# Patient Record
Sex: Female | Born: 1945 | Race: White | Hispanic: No | Marital: Married | Smoking: Never smoker
Health system: Southern US, Community
[De-identification: ages and names within clinical notes are randomized; demographics above are authoritative.]

## PROBLEM LIST (undated history)

## (undated) DIAGNOSIS — M1990 Unspecified osteoarthritis, unspecified site: Secondary | ICD-10-CM

## (undated) DIAGNOSIS — F419 Anxiety disorder, unspecified: Secondary | ICD-10-CM

## (undated) DIAGNOSIS — I10 Essential (primary) hypertension: Secondary | ICD-10-CM

## (undated) HISTORY — PX: COLONOSCOPY: SHX174

## (undated) HISTORY — PX: TONSILLECTOMY: SUR1361

## (undated) HISTORY — PX: ABDOMINAL HYSTERECTOMY: SHX81

## (undated) HISTORY — DX: Essential (primary) hypertension: I10

---

## 2001-04-28 ENCOUNTER — Encounter: Payer: Self-pay | Admitting: Family Medicine

## 2001-04-28 ENCOUNTER — Ambulatory Visit (HOSPITAL_COMMUNITY): Admission: RE | Admit: 2001-04-28 | Discharge: 2001-04-28 | Payer: Self-pay | Admitting: Family Medicine

## 2001-05-20 ENCOUNTER — Ambulatory Visit (HOSPITAL_COMMUNITY): Admission: RE | Admit: 2001-05-20 | Discharge: 2001-05-20 | Payer: Self-pay | Admitting: General Surgery

## 2004-05-15 ENCOUNTER — Ambulatory Visit (HOSPITAL_COMMUNITY): Admission: RE | Admit: 2004-05-15 | Discharge: 2004-05-15 | Payer: Self-pay | Admitting: Family Medicine

## 2006-08-12 ENCOUNTER — Ambulatory Visit (HOSPITAL_COMMUNITY): Admission: RE | Admit: 2006-08-12 | Discharge: 2006-08-12 | Payer: Self-pay | Admitting: Family Medicine

## 2006-08-20 ENCOUNTER — Encounter: Admission: RE | Admit: 2006-08-20 | Discharge: 2006-08-20 | Payer: Self-pay | Admitting: Family Medicine

## 2007-09-01 ENCOUNTER — Encounter: Admission: RE | Admit: 2007-09-01 | Discharge: 2007-09-01 | Payer: Self-pay | Admitting: Family Medicine

## 2010-08-20 ENCOUNTER — Encounter: Payer: Self-pay | Admitting: Family Medicine

## 2011-09-27 ENCOUNTER — Ambulatory Visit: Payer: Medicare Other | Attending: Orthopedic Surgery | Admitting: Physical Therapy

## 2011-09-27 DIAGNOSIS — IMO0001 Reserved for inherently not codable concepts without codable children: Secondary | ICD-10-CM | POA: Insufficient documentation

## 2011-09-27 DIAGNOSIS — M25569 Pain in unspecified knee: Secondary | ICD-10-CM | POA: Insufficient documentation

## 2011-09-27 DIAGNOSIS — R5381 Other malaise: Secondary | ICD-10-CM | POA: Insufficient documentation

## 2011-10-29 ENCOUNTER — Emergency Department (HOSPITAL_COMMUNITY): Payer: Medicare Other

## 2011-10-29 ENCOUNTER — Emergency Department (HOSPITAL_COMMUNITY)
Admission: EM | Admit: 2011-10-29 | Discharge: 2011-10-29 | Disposition: A | Discharge status: Home / Self Care | Payer: Medicare Other | Attending: Emergency Medicine | Admitting: Emergency Medicine

## 2011-10-29 ENCOUNTER — Encounter (HOSPITAL_COMMUNITY): Payer: Self-pay | Admitting: *Deleted

## 2011-10-29 DIAGNOSIS — M25539 Pain in unspecified wrist: Secondary | ICD-10-CM | POA: Insufficient documentation

## 2011-10-29 DIAGNOSIS — W19XXXA Unspecified fall, initial encounter: Secondary | ICD-10-CM | POA: Insufficient documentation

## 2011-10-29 DIAGNOSIS — Y9289 Other specified places as the place of occurrence of the external cause: Secondary | ICD-10-CM | POA: Insufficient documentation

## 2011-10-29 DIAGNOSIS — S62109A Fracture of unspecified carpal bone, unspecified wrist, initial encounter for closed fracture: Secondary | ICD-10-CM | POA: Insufficient documentation

## 2011-10-29 MED ORDER — OXYCODONE-ACETAMINOPHEN 5-325 MG PO TABS: 1.0000 | ORAL_TABLET | ORAL | Status: AC | PRN

## 2011-10-29 MED ORDER — OXYCODONE-ACETAMINOPHEN 5-325 MG PO TABS
1.0000 | ORAL_TABLET | Freq: Once | ORAL | Status: AC
  Administered 2011-10-29: 1 via ORAL
  Filled 2011-10-29: qty 1

## 2011-10-29 MED ORDER — FENTANYL CITRATE 0.05 MG/ML IJ SOLN: 50.0000 ug | INTRAMUSCULAR | Status: DC

## 2011-10-29 NOTE — ED Notes (Signed)
Splint requested from ortho tech

## 2011-10-29 NOTE — ED Notes (Signed)
Ortho at bedside.

## 2011-10-29 NOTE — Progress Notes (Signed)
Orthopedic Tech Progress Note Patient Details:  Joanna Jones July 25, 1946 [id number]  Type of Splint: Short Arm Splint Location: (L) UE Splint Interventions: Application    Jennye Moccasin 10/29/2011, 5:58 PM

## 2011-10-29 NOTE — ED Notes (Signed)
Pt stated that she fell in the ED parking lot and hurt her left wrist. Pt able to move hand. Capillary refill brisk. Arm warm to touch. Swelling present. No deformity noted. Ice on arm. Will continue to monitor.

## 2011-10-29 NOTE — Progress Notes (Signed)
Orthopedic Tech Progress Note Patient Details:  Joanna Jones Sep 18, 1945 [id number]       Jennye Moccasin 10/29/2011, 5:56 PM

## 2011-10-29 NOTE — ED Notes (Signed)
The pt  Larey Seat in the parking lot here  When she tripped over a sign.  c c/o lt wrist pain

## 2011-10-29 NOTE — Discharge Instructions (Signed)
Wrist Fracture Your caregiver has diagnosed you as having a fracture of the wrist. A fracture is a break in the bone or bones. A cast or splint is used to protect and keep your injured bone(s) from moving. The cast or splint will usually be on for about 5 to 6 weeks. One of the bones of the wrist (the navicular bone) often does not show up as a fracture on X-ray until later or in the healing phase. With this bone your caregiver will often cast as though it is fractured even if not seen on the X-ray. HOME CARE INSTRUCTIONS   To lessen the swelling, keep the injured part elevated while sitting or lying down. Keeping the injury above the level of your heart (the center of the chest) will decrease swelling and pain.   Do not wear rings or jewelry on the injured hand or wrist.   Apply ice to the injury for 15 to 20 minutes, 3 to 4 times per day while awake for 2 days. Put the ice in a plastic bag and place a thin towel between the bag of ice and your cast.   If you have a plaster or fiberglass cast:   Do not try to scratch the skin under the cast using sharp or pointed objects.   Check the skin around the cast every day. You may put lotion on any red or sore areas.   Keep your cast dry and clean.   If you have a plaster splint:   Wear the splint as directed.   You may loosen the elastic around the splint if your fingers become numb, tingle, or turn cold or blue.   If you have been put in a removable splint, wear and use as directed.   Do not use powders or deodorants in or around the cast or splint.   Do not remove padding from your cast or splint.   Do not put pressure on any part of your cast or splint. It may break. Rest your cast or splint only on a pillow the first 24 hours until it is fully hardened.   Gently move your fingers often, so they do not get stiff.   Do not remove the splint unless directed by your caregiver. Casts must be removed by an orthopedist.   Your cast or  splint can be protected during bathing with a plastic bag. Do not lower the cast or splint into water.   Only take over-the-counter or prescription medicines for pain, discomfort, or fever as directed by your caregiver.   Follow up with your caregiver as directed.  SEEK IMMEDIATE MEDICAL CARE IF:   Your cast or splint gets damaged or breaks.   Your cast or splint feels too tight or loose.   You have increased pain, not controlled with medication.   You have increased swelling.   Your skin or nails below the injury turn blue or grey or feel cold or numb.   You have trouble moving or feeling your fingers.   You experience any burning or stinging from the cast or splint.   There is a bad smell coming from under the cast or splint.   New stains or fluids are coming from under the cast or splint.   You have any new injuries while wearing the cast or splint.  Document Released: 04/25/2005 Document Revised: 07/05/2011 Document Reviewed: 02/12/2007 [facility] Patient Information 2012 [place], [place].Cast or Splint Care Casts and splints support injured limbs and keep bones  from moving while they heal.  HOME CARE  Keep the cast or splint uncovered during the drying period.   A plaster cast can take 24 to 48 hours to dry.   A fiberglass cast will dry in less than 1 hour.   Do not rest the cast on anything harder than a pillow for 24 hours.   Do not put weight on your injured limb. Do not put pressure on the cast. Wait for your doctor's approval.   Keep the cast or splint dry.   Cover the cast or splint with a plastic bag during baths or wet weather.   If you have a cast over your chest and belly (trunk), take sponge baths until the cast is taken off.   Keep your cast or splint clean. Wash a dirty cast with a damp cloth.   Do not put any objects under your cast or splint. Do not scratch the skin under the cast with an object.   Do not take out the padding from inside your cast.     Exercise your joints near the cast as told by your doctor.   Raise (elevate) your injured limb on 1 or 2 pillows for the first 1 to 3 days.  GET HELP RIGHT AWAY IF:  Your cast or splint cracks.   Your cast or splint is too tight or too loose.   You itch badly under the cast.   Your cast gets wet or has a soft spot.   You have a bad smell coming from the cast.   You get an object stuck under the cast.   Your skin around the cast becomes red or raw.   You have new or more pain after the cast is put on.   You have fluid leaking through the cast.   You cannot move your fingers or toes.   Your fingers or toes turn colors or are cool, painful, or puffy (swollen).   You have tingling or lose feeling (numbness) around the injured area.   You have pain or pressure under the cast.   You have trouble breathing or have shortness of breath.   You have chest pain.  MAKE SURE YOU:  Understand these instructions.   Will watch your condition.   Will get help right away if you are not doing well or get worse.  Document Released: 11/15/2010 Document Revised: 07/05/2011 Document Reviewed: 11/15/2010 [facility] Patient Information 2012 [place], [place].

## 2011-10-29 NOTE — ED Provider Notes (Signed)
History     CSN: [id number]  Arrival date & time 10/29/11  1551   First MD Initiated Contact with Patient 10/29/11 1727      Chief Complaint  Patient presents with  . Wrist Pain    (Consider location/radiation/quality/duration/timing/severity/associated sxs/prior treatment) Patient is a 66 y.o. female presenting with wrist pain. The history is provided by the patient.  Wrist Pain   patient fell in the parking lot onto her left wrist. No loss of consciousness pain is worse with movement described as sharp. Better with remaining still. Denies any hip or lower back pain. Denies any elbow pain. No paresthesias of her left hand  History reviewed. No pertinent past medical history.  History reviewed. No pertinent past surgical history.  No family history on file.  History  Substance Use Topics  . Smoking status: Never Smoker   . Smokeless tobacco: Not on file  . Alcohol Use: No    OB History    Grav Para Term Preterm Abortions TAB SAB Ect Mult Living                  Review of Systems  All other systems reviewed and are negative.    Allergies  Compazine  Home Medications   Current Outpatient Rx  Name Route Sig Dispense Refill  . ESTROGENS CONJUGATED 1.25 MG PO TABS Oral Take 0.625 mg by mouth daily.      BP 166/81  Pulse 67  Temp(Src) 97.8 F (36.6 C) (Oral)  Resp 16  SpO2 97%  Physical Exam  Nursing note and vitals reviewed. Constitutional: She is oriented to person, place, and time. She appears well-developed and well-nourished.  Non-toxic appearance.  HENT:  Head: Normocephalic and atraumatic.  Eyes: Conjunctivae are normal. Pupils are equal, round, and reactive to light.  Neck: Normal range of motion.  Cardiovascular: Normal rate.   Pulmonary/Chest: Effort normal.  Musculoskeletal:       Left wrist: She exhibits tenderness, bony tenderness and swelling.       Arms: Neurological: She is alert and oriented to person, place, and time.  Skin: Skin is  warm and dry.  Psychiatric: She has a normal mood and affect.    ED Course  Procedures (including critical care time)  Labs Reviewed - No data to display Dg Wrist Complete Left  10/29/2011  *RADIOLOGY REPORT*  Clinical Data: Wrist pain  LEFT WRIST - COMPLETE 3+ VIEW  Comparison: None.  Findings: Diffuse osteopenia.  There is a small nondisplaced fracture involving the radial styloid.  No subluxations.  No radiopaque foreign bodies or soft tissue calcifications.  IMPRESSION:  1. Nondisplaced radial styloid fracture.  2.  Osteopenia.  Original Report Authenticated By: Rosealee Albee, M.D.     No diagnosis found.    MDM  X-rays results reviewed. Patient given Percocet for pain. She'll be placed in a splint and given referral to hand surgeon on call        Toy Baker, MD 10/29/11 1734

## 2011-12-10 ENCOUNTER — Other Ambulatory Visit (HOSPITAL_COMMUNITY): Payer: Self-pay | Admitting: Family Medicine

## 2011-12-10 ENCOUNTER — Ambulatory Visit (HOSPITAL_COMMUNITY)
Admission: RE | Admit: 2011-12-10 | Discharge: 2011-12-10 | Disposition: A | Discharge status: Home / Self Care | Payer: Medicare Other | Source: Ambulatory Visit | Attending: Family Medicine | Admitting: Family Medicine

## 2011-12-10 DIAGNOSIS — R079 Chest pain, unspecified: Secondary | ICD-10-CM

## 2012-01-07 ENCOUNTER — Other Ambulatory Visit (HOSPITAL_COMMUNITY): Payer: Self-pay | Admitting: Family Medicine

## 2012-01-07 DIAGNOSIS — Z139 Encounter for screening, unspecified: Secondary | ICD-10-CM

## 2012-01-10 ENCOUNTER — Other Ambulatory Visit (HOSPITAL_COMMUNITY): Payer: Medicare Other

## 2012-01-10 ENCOUNTER — Ambulatory Visit (HOSPITAL_COMMUNITY): Payer: Medicare Other

## 2012-01-14 ENCOUNTER — Ambulatory Visit (HOSPITAL_COMMUNITY)
Admission: RE | Admit: 2012-01-14 | Discharge: 2012-01-14 | Disposition: A | Discharge status: Home / Self Care | Payer: Medicare Other | Source: Ambulatory Visit | Attending: Family Medicine | Admitting: Family Medicine

## 2012-01-14 DIAGNOSIS — Z139 Encounter for screening, unspecified: Secondary | ICD-10-CM

## 2012-01-14 DIAGNOSIS — Z1231 Encounter for screening mammogram for malignant neoplasm of breast: Secondary | ICD-10-CM | POA: Insufficient documentation

## 2012-01-14 DIAGNOSIS — Z13820 Encounter for screening for osteoporosis: Secondary | ICD-10-CM | POA: Insufficient documentation

## 2012-01-14 DIAGNOSIS — M818 Other osteoporosis without current pathological fracture: Secondary | ICD-10-CM | POA: Insufficient documentation

## 2013-09-03 ENCOUNTER — Ambulatory Visit (HOSPITAL_COMMUNITY)
Admission: RE | Admit: 2013-09-03 | Discharge: 2013-09-03 | Disposition: A | Discharge status: Home / Self Care | Payer: Medicare Other | Source: Ambulatory Visit | Attending: Family Medicine | Admitting: Family Medicine

## 2013-09-03 ENCOUNTER — Other Ambulatory Visit (HOSPITAL_COMMUNITY): Payer: Self-pay | Admitting: Family Medicine

## 2013-09-03 DIAGNOSIS — J069 Acute upper respiratory infection, unspecified: Secondary | ICD-10-CM

## 2013-09-03 DIAGNOSIS — R05 Cough: Secondary | ICD-10-CM | POA: Insufficient documentation

## 2013-09-03 DIAGNOSIS — R059 Cough, unspecified: Secondary | ICD-10-CM | POA: Insufficient documentation

## 2013-09-03 DIAGNOSIS — R079 Chest pain, unspecified: Secondary | ICD-10-CM | POA: Insufficient documentation

## 2013-09-03 DIAGNOSIS — R0989 Other specified symptoms and signs involving the circulatory and respiratory systems: Secondary | ICD-10-CM | POA: Insufficient documentation

## 2015-02-22 ENCOUNTER — Other Ambulatory Visit (HOSPITAL_COMMUNITY): Payer: Self-pay | Admitting: Family Medicine

## 2015-02-22 DIAGNOSIS — Z1389 Encounter for screening for other disorder: Secondary | ICD-10-CM

## 2015-02-22 DIAGNOSIS — Z0000 Encounter for general adult medical examination without abnormal findings: Secondary | ICD-10-CM | POA: Diagnosis not present

## 2015-02-22 DIAGNOSIS — Z139 Encounter for screening, unspecified: Secondary | ICD-10-CM

## 2015-02-22 DIAGNOSIS — Z6824 Body mass index (BMI) 24.0-24.9, adult: Secondary | ICD-10-CM | POA: Diagnosis not present

## 2015-02-22 DIAGNOSIS — E782 Mixed hyperlipidemia: Secondary | ICD-10-CM | POA: Diagnosis not present

## 2015-02-28 ENCOUNTER — Ambulatory Visit (HOSPITAL_COMMUNITY)
Admission: RE | Admit: 2015-02-28 | Discharge: 2015-02-28 | Disposition: A | Discharge status: Home / Self Care | Payer: Medicare Other | Source: Ambulatory Visit | Attending: Family Medicine | Admitting: Family Medicine

## 2015-02-28 DIAGNOSIS — Z79890 Hormone replacement therapy (postmenopausal): Secondary | ICD-10-CM | POA: Diagnosis not present

## 2015-02-28 DIAGNOSIS — Z1231 Encounter for screening mammogram for malignant neoplasm of breast: Secondary | ICD-10-CM | POA: Insufficient documentation

## 2015-02-28 DIAGNOSIS — Z1389 Encounter for screening for other disorder: Secondary | ICD-10-CM

## 2015-02-28 DIAGNOSIS — Z780 Asymptomatic menopausal state: Secondary | ICD-10-CM | POA: Insufficient documentation

## 2015-02-28 DIAGNOSIS — M810 Age-related osteoporosis without current pathological fracture: Secondary | ICD-10-CM | POA: Insufficient documentation

## 2015-02-28 DIAGNOSIS — Z139 Encounter for screening, unspecified: Secondary | ICD-10-CM

## 2015-03-04 DIAGNOSIS — M79621 Pain in right upper arm: Secondary | ICD-10-CM | POA: Diagnosis not present

## 2015-03-04 DIAGNOSIS — S79921A Unspecified injury of right thigh, initial encounter: Secondary | ICD-10-CM | POA: Diagnosis not present

## 2015-03-04 DIAGNOSIS — Z90710 Acquired absence of both cervix and uterus: Secondary | ICD-10-CM | POA: Diagnosis not present

## 2015-03-04 DIAGNOSIS — M79651 Pain in right thigh: Secondary | ICD-10-CM | POA: Diagnosis not present

## 2015-03-04 DIAGNOSIS — E785 Hyperlipidemia, unspecified: Secondary | ICD-10-CM | POA: Diagnosis not present

## 2015-03-04 DIAGNOSIS — Z79890 Hormone replacement therapy (postmenopausal): Secondary | ICD-10-CM | POA: Diagnosis not present

## 2015-03-04 DIAGNOSIS — W1839XA Other fall on same level, initial encounter: Secondary | ICD-10-CM | POA: Diagnosis not present

## 2015-03-04 DIAGNOSIS — S4991XA Unspecified injury of right shoulder and upper arm, initial encounter: Secondary | ICD-10-CM | POA: Diagnosis not present

## 2015-03-04 DIAGNOSIS — E0590 Thyrotoxicosis, unspecified without thyrotoxic crisis or storm: Secondary | ICD-10-CM | POA: Diagnosis not present

## 2015-03-04 DIAGNOSIS — M79601 Pain in right arm: Secondary | ICD-10-CM | POA: Diagnosis not present

## 2015-03-04 DIAGNOSIS — S72001A Fracture of unspecified part of neck of right femur, initial encounter for closed fracture: Secondary | ICD-10-CM | POA: Diagnosis not present

## 2015-03-05 ENCOUNTER — Inpatient Hospital Stay (HOSPITAL_COMMUNITY): Payer: Medicare Other

## 2015-03-05 ENCOUNTER — Inpatient Hospital Stay (HOSPITAL_COMMUNITY): Payer: Medicare Other | Admitting: Anesthesiology

## 2015-03-05 ENCOUNTER — Inpatient Hospital Stay (HOSPITAL_COMMUNITY)
Admission: AD | Admit: 2015-03-05 | Discharge: 2015-03-08 | DRG: 470 | Disposition: A | Discharge status: Skilled Nursing Facility | Payer: Medicare Other | Source: Other Acute Inpatient Hospital | Attending: Internal Medicine | Admitting: Internal Medicine

## 2015-03-05 ENCOUNTER — Encounter (HOSPITAL_COMMUNITY)
Admission: AD | Disposition: A | Discharge status: Skilled Nursing Facility | Payer: Self-pay | Source: Other Acute Inpatient Hospital | Attending: Internal Medicine

## 2015-03-05 ENCOUNTER — Encounter (HOSPITAL_COMMUNITY): Payer: Self-pay | Admitting: Internal Medicine

## 2015-03-05 DIAGNOSIS — Z79899 Other long term (current) drug therapy: Secondary | ICD-10-CM | POA: Diagnosis not present

## 2015-03-05 DIAGNOSIS — S72001A Fracture of unspecified part of neck of right femur, initial encounter for closed fracture: Secondary | ICD-10-CM | POA: Diagnosis not present

## 2015-03-05 DIAGNOSIS — M25551 Pain in right hip: Secondary | ICD-10-CM | POA: Diagnosis not present

## 2015-03-05 DIAGNOSIS — R52 Pain, unspecified: Secondary | ICD-10-CM

## 2015-03-05 DIAGNOSIS — S72011A Unspecified intracapsular fracture of right femur, initial encounter for closed fracture: Secondary | ICD-10-CM | POA: Diagnosis not present

## 2015-03-05 DIAGNOSIS — E876 Hypokalemia: Secondary | ICD-10-CM | POA: Diagnosis not present

## 2015-03-05 DIAGNOSIS — Y92009 Unspecified place in unspecified non-institutional (private) residence as the place of occurrence of the external cause: Secondary | ICD-10-CM | POA: Diagnosis not present

## 2015-03-05 DIAGNOSIS — M818 Other osteoporosis without current pathological fracture: Secondary | ICD-10-CM | POA: Diagnosis not present

## 2015-03-05 DIAGNOSIS — W010XXA Fall on same level from slipping, tripping and stumbling without subsequent striking against object, initial encounter: Secondary | ICD-10-CM | POA: Diagnosis not present

## 2015-03-05 DIAGNOSIS — Z8249 Family history of ischemic heart disease and other diseases of the circulatory system: Secondary | ICD-10-CM | POA: Diagnosis not present

## 2015-03-05 DIAGNOSIS — S72041A Displaced fracture of base of neck of right femur, initial encounter for closed fracture: Secondary | ICD-10-CM | POA: Diagnosis not present

## 2015-03-05 DIAGNOSIS — Z96641 Presence of right artificial hip joint: Secondary | ICD-10-CM | POA: Diagnosis not present

## 2015-03-05 DIAGNOSIS — D500 Iron deficiency anemia secondary to blood loss (chronic): Secondary | ICD-10-CM | POA: Diagnosis not present

## 2015-03-05 DIAGNOSIS — S72009A Fracture of unspecified part of neck of unspecified femur, initial encounter for closed fracture: Secondary | ICD-10-CM | POA: Diagnosis present

## 2015-03-05 DIAGNOSIS — Z833 Family history of diabetes mellitus: Secondary | ICD-10-CM

## 2015-03-05 DIAGNOSIS — Z471 Aftercare following joint replacement surgery: Secondary | ICD-10-CM | POA: Diagnosis not present

## 2015-03-05 DIAGNOSIS — M6281 Muscle weakness (generalized): Secondary | ICD-10-CM | POA: Diagnosis not present

## 2015-03-05 DIAGNOSIS — M62838 Other muscle spasm: Secondary | ICD-10-CM | POA: Diagnosis not present

## 2015-03-05 DIAGNOSIS — S7290XC Unspecified fracture of unspecified femur, initial encounter for open fracture type IIIA, IIIB, or IIIC: Secondary | ICD-10-CM | POA: Diagnosis not present

## 2015-03-05 DIAGNOSIS — S7291XA Unspecified fracture of right femur, initial encounter for closed fracture: Secondary | ICD-10-CM

## 2015-03-05 DIAGNOSIS — M79631 Pain in right forearm: Secondary | ICD-10-CM | POA: Diagnosis not present

## 2015-03-05 DIAGNOSIS — K5900 Constipation, unspecified: Secondary | ICD-10-CM | POA: Diagnosis not present

## 2015-03-05 DIAGNOSIS — F411 Generalized anxiety disorder: Secondary | ICD-10-CM | POA: Diagnosis not present

## 2015-03-05 DIAGNOSIS — M21251 Flexion deformity, right hip: Secondary | ICD-10-CM | POA: Diagnosis not present

## 2015-03-05 DIAGNOSIS — Z9889 Other specified postprocedural states: Secondary | ICD-10-CM | POA: Diagnosis not present

## 2015-03-05 DIAGNOSIS — Z87828 Personal history of other (healed) physical injury and trauma: Secondary | ICD-10-CM | POA: Diagnosis not present

## 2015-03-05 HISTORY — PX: HIP ARTHROPLASTY: SHX981

## 2015-03-05 LAB — BASIC METABOLIC PANEL
Anion gap: 6 (ref 5–15)
BUN: 8 mg/dL (ref 6–20)
CALCIUM: 8.5 mg/dL — AB (ref 8.9–10.3)
CO2: 25 mmol/L (ref 22–32)
Chloride: 108 mmol/L (ref 101–111)
Creatinine, Ser: 0.52 mg/dL (ref 0.44–1.00)
GFR calc Af Amer: >60 mL/min (ref >60)
GFR calc non Af Amer: >60 mL/min (ref >60)
Glucose, Bld: 109 mg/dL — ABNORMAL HIGH (ref 65–99)
Potassium: 3.6 mmol/L (ref 3.5–5.1)
SODIUM: 139 mmol/L (ref 135–145)

## 2015-03-05 LAB — SURGICAL PCR SCREEN
MRSA, PCR: NEGATIVE
Staphylococcus aureus: NEGATIVE

## 2015-03-05 LAB — CBC
HCT: 36.7 % (ref 36.0–46.0)
HEMOGLOBIN: 12 g/dL (ref 12.0–15.0)
MCH: 29 pg (ref 26.0–34.0)
MCHC: 32.7 g/dL (ref 30.0–36.0)
MCV: 88.6 fL (ref 78.0–100.0)
PLATELETS: 138 10*3/uL — AB (ref 150–400)
RBC: 4.14 MIL/uL (ref 3.87–5.11)
RDW: 12.6 % (ref 11.5–15.5)
WBC: 5.3 10*3/uL (ref 4.0–10.5)

## 2015-03-05 SURGERY — HEMIARTHROPLASTY, HIP, DIRECT ANTERIOR APPROACH, FOR FRACTURE
Anesthesia: General | Site: Hip | Laterality: Right

## 2015-03-05 MED ORDER — FENTANYL CITRATE (PF) 100 MCG/2ML IJ SOLN
100.0000 ug | INTRAMUSCULAR | Status: DC | PRN
  Administered 2015-03-05 – 2015-03-06 (×4): 100 ug via INTRAVENOUS
  Filled 2015-03-05 (×4): qty 2

## 2015-03-05 MED ORDER — ONDANSETRON HCL 4 MG PO TABS: 4.0000 mg | ORAL_TABLET | Freq: Four times a day (QID) | ORAL | Status: DC | PRN

## 2015-03-05 MED ORDER — ACETAMINOPHEN 650 MG RE SUPP
650.0000 mg | Freq: Four times a day (QID) | RECTAL | Status: DC | PRN
Start: 2015-03-05 — End: 2015-03-08

## 2015-03-05 MED ORDER — LACTATED RINGERS IV SOLN
INTRAVENOUS | Status: DC | PRN
  Administered 2015-03-05 (×2): via INTRAVENOUS

## 2015-03-05 MED ORDER — GLYCOPYRROLATE 0.2 MG/ML IJ SOLN
INTRAMUSCULAR | Status: DC | PRN
  Administered 2015-03-05: 0.2 mg via INTRAVENOUS

## 2015-03-05 MED ORDER — HYDROMORPHONE HCL 1 MG/ML IJ SOLN
INTRAMUSCULAR | Status: AC
  Filled 2015-03-05: qty 1

## 2015-03-05 MED ORDER — DEXAMETHASONE SODIUM PHOSPHATE 10 MG/ML IJ SOLN
INTRAMUSCULAR | Status: DC | PRN
  Administered 2015-03-05: 10 mg via INTRAVENOUS

## 2015-03-05 MED ORDER — ACETAMINOPHEN 325 MG PO TABS: 650.0000 mg | ORAL_TABLET | Freq: Four times a day (QID) | ORAL | Status: DC | PRN

## 2015-03-05 MED ORDER — ONDANSETRON HCL 4 MG/2ML IJ SOLN: 4.0000 mg | Freq: Four times a day (QID) | INTRAMUSCULAR | Status: DC | PRN

## 2015-03-05 MED ORDER — MIDAZOLAM HCL 2 MG/2ML IJ SOLN
INTRAMUSCULAR | Status: AC
  Filled 2015-03-05: qty 4

## 2015-03-05 MED ORDER — CEFAZOLIN SODIUM-DEXTROSE 2-3 GM-% IV SOLR
INTRAVENOUS | Status: AC
  Filled 2015-03-05: qty 50

## 2015-03-05 MED ORDER — PROPOFOL 10 MG/ML IV BOLUS
INTRAVENOUS | Status: DC | PRN
  Administered 2015-03-05: 100 mg via INTRAVENOUS

## 2015-03-05 MED ORDER — LORAZEPAM 2 MG/ML IJ SOLN: 0.5000 mg | Freq: Four times a day (QID) | INTRAMUSCULAR | Status: DC | PRN

## 2015-03-05 MED ORDER — DOCUSATE SODIUM 100 MG PO CAPS
100.0000 mg | ORAL_CAPSULE | Freq: Two times a day (BID) | ORAL | Status: DC
  Administered 2015-03-05 – 2015-03-08 (×5): 100 mg via ORAL

## 2015-03-05 MED ORDER — ACETAMINOPHEN 325 MG PO TABS
650.0000 mg | ORAL_TABLET | Freq: Four times a day (QID) | ORAL | Status: DC | PRN
  Filled 2015-03-05: qty 2

## 2015-03-05 MED ORDER — PROPOFOL 10 MG/ML IV BOLUS
INTRAVENOUS | Status: AC
  Filled 2015-03-05: qty 20

## 2015-03-05 MED ORDER — METHOCARBAMOL 500 MG PO TABS
500.0000 mg | ORAL_TABLET | Freq: Four times a day (QID) | ORAL | Status: DC | PRN
  Administered 2015-03-07 – 2015-03-08 (×5): 500 mg via ORAL
  Filled 2015-03-05 (×5): qty 1

## 2015-03-05 MED ORDER — HYDROMORPHONE HCL 1 MG/ML IJ SOLN
0.2500 mg | INTRAMUSCULAR | Status: DC | PRN
  Administered 2015-03-05 (×2): 0.5 mg via INTRAVENOUS

## 2015-03-05 MED ORDER — OXYCODONE HCL 5 MG PO TABS
5.0000 mg | ORAL_TABLET | ORAL | Status: DC | PRN
  Administered 2015-03-05 – 2015-03-08 (×8): 5 mg via ORAL
  Filled 2015-03-05 (×9): qty 1

## 2015-03-05 MED ORDER — ALUM & MAG HYDROXIDE-SIMETH 200-200-20 MG/5ML PO SUSP: 30.0000 mL | Freq: Four times a day (QID) | ORAL | Status: DC | PRN

## 2015-03-05 MED ORDER — ONDANSETRON HCL 4 MG/2ML IJ SOLN
INTRAMUSCULAR | Status: AC
Start: 2015-03-05 — End: 2015-03-05
  Filled 2015-03-05: qty 2

## 2015-03-05 MED ORDER — ESTROGENS CONJUGATED 0.625 MG PO TABS
0.6250 mg | ORAL_TABLET | Freq: Every day | ORAL | Status: DC
  Administered 2015-03-05 – 2015-03-08 (×3): 0.625 mg via ORAL
  Filled 2015-03-05 (×4): qty 1

## 2015-03-05 MED ORDER — CEFAZOLIN SODIUM-DEXTROSE 2-3 GM-% IV SOLR
2.0000 g | INTRAVENOUS | Status: AC
  Administered 2015-03-05: 2 g via INTRAVENOUS

## 2015-03-05 MED ORDER — PHENOL 1.4 % MT LIQD
1.0000 | OROMUCOSAL | Status: DC | PRN
  Filled 2015-03-05: qty 177

## 2015-03-05 MED ORDER — LORAZEPAM 2 MG/ML IJ SOLN: 0.5000 mg | Freq: Four times a day (QID) | INTRAMUSCULAR | Status: DC

## 2015-03-05 MED ORDER — ACETAMINOPHEN 650 MG RE SUPP: 650.0000 mg | Freq: Four times a day (QID) | RECTAL | Status: DC | PRN

## 2015-03-05 MED ORDER — POLYVINYL ALCOHOL 1.4 % OP SOLN
1.0000 [drp] | Freq: Four times a day (QID) | OPHTHALMIC | Status: DC | PRN
  Filled 2015-03-05: qty 15

## 2015-03-05 MED ORDER — SUCCINYLCHOLINE CHLORIDE 20 MG/ML IJ SOLN
INTRAMUSCULAR | Status: DC | PRN
  Administered 2015-03-05: 100 mg via INTRAVENOUS

## 2015-03-05 MED ORDER — METHOCARBAMOL 500 MG PO TABS: 500.0000 mg | ORAL_TABLET | Freq: Three times a day (TID) | ORAL | Status: DC | PRN

## 2015-03-05 MED ORDER — ENOXAPARIN SODIUM 40 MG/0.4ML ~~LOC~~ SOLN: 40.0000 mg | SUBCUTANEOUS | Status: DC

## 2015-03-05 MED ORDER — HYDROCODONE-ACETAMINOPHEN 5-325 MG PO TABS: 1.0000 | ORAL_TABLET | Freq: Four times a day (QID) | ORAL | Status: DC | PRN

## 2015-03-05 MED ORDER — HYDROMORPHONE HCL 1 MG/ML IJ SOLN
0.5000 mg | INTRAMUSCULAR | Status: DC | PRN
  Administered 2015-03-05 – 2015-03-07 (×6): 0.5 mg via INTRAVENOUS
  Filled 2015-03-05 (×6): qty 1

## 2015-03-05 MED ORDER — SODIUM CHLORIDE 0.9 % IV SOLN
INTRAVENOUS | Status: DC
  Administered 2015-03-05 – 2015-03-06 (×3): via INTRAVENOUS
  Administered 2015-03-06: 75 mL/h via INTRAVENOUS

## 2015-03-05 MED ORDER — DEXAMETHASONE SODIUM PHOSPHATE 10 MG/ML IJ SOLN
INTRAMUSCULAR | Status: AC
Start: 2015-03-05 — End: 2015-03-05
  Filled 2015-03-05: qty 1

## 2015-03-05 MED ORDER — BISACODYL 10 MG RE SUPP: 10.0000 mg | Freq: Every day | RECTAL | Status: DC | PRN

## 2015-03-05 MED ORDER — MIDAZOLAM HCL 5 MG/5ML IJ SOLN
INTRAMUSCULAR | Status: DC | PRN
  Administered 2015-03-05 (×2): 1 mg via INTRAVENOUS

## 2015-03-05 MED ORDER — ONDANSETRON HCL 4 MG/2ML IJ SOLN
INTRAMUSCULAR | Status: DC | PRN
  Administered 2015-03-05: 4 mg via INTRAVENOUS

## 2015-03-05 MED ORDER — FENTANYL CITRATE (PF) 250 MCG/5ML IJ SOLN
INTRAMUSCULAR | Status: DC | PRN
  Administered 2015-03-05: 100 ug via INTRAVENOUS
  Administered 2015-03-05 (×3): 50 ug via INTRAVENOUS

## 2015-03-05 MED ORDER — 0.9 % SODIUM CHLORIDE (POUR BTL) OPTIME
TOPICAL | Status: DC | PRN
  Administered 2015-03-05: 1000 mL

## 2015-03-05 MED ORDER — SODIUM CHLORIDE 0.9 % IV SOLN
INTRAVENOUS | Status: AC
  Administered 2015-03-05: 04:00:00 via INTRAVENOUS

## 2015-03-05 MED ORDER — METOCLOPRAMIDE HCL 5 MG/ML IJ SOLN: 5.0000 mg | Freq: Three times a day (TID) | INTRAMUSCULAR | Status: DC | PRN

## 2015-03-05 MED ORDER — METOCLOPRAMIDE HCL 10 MG PO TABS: 5.0000 mg | ORAL_TABLET | Freq: Three times a day (TID) | ORAL | Status: DC | PRN

## 2015-03-05 MED ORDER — CETYLPYRIDINIUM CHLORIDE 0.05 % MT LIQD: 7.0000 mL | Freq: Two times a day (BID) | OROMUCOSAL | Status: DC

## 2015-03-05 MED ORDER — LORAZEPAM 0.5 MG PO TABS
0.5000 mg | ORAL_TABLET | Freq: Three times a day (TID) | ORAL | Status: DC | PRN
  Administered 2015-03-06 – 2015-03-08 (×2): 0.5 mg via ORAL
  Filled 2015-03-05 (×2): qty 1

## 2015-03-05 MED ORDER — HYDROCODONE-ACETAMINOPHEN 5-325 MG PO TABS
1.0000 | ORAL_TABLET | Freq: Four times a day (QID) | ORAL | Status: DC | PRN
  Administered 2015-03-06: 1 via ORAL
  Administered 2015-03-06 – 2015-03-08 (×2): 2 via ORAL
  Filled 2015-03-05: qty 1
  Filled 2015-03-05 (×3): qty 2

## 2015-03-05 MED ORDER — ENOXAPARIN SODIUM 40 MG/0.4ML ~~LOC~~ SOLN
40.0000 mg | SUBCUTANEOUS | Status: DC
  Administered 2015-03-06 – 2015-03-08 (×3): 40 mg via SUBCUTANEOUS
  Filled 2015-03-05 (×4): qty 0.4

## 2015-03-05 MED ORDER — MENTHOL 3 MG MT LOZG: 1.0000 | LOZENGE | OROMUCOSAL | Status: DC | PRN

## 2015-03-05 MED ORDER — FENTANYL CITRATE (PF) 250 MCG/5ML IJ SOLN
INTRAMUSCULAR | Status: AC
  Filled 2015-03-05: qty 25

## 2015-03-05 MED ORDER — CEFAZOLIN SODIUM-DEXTROSE 2-3 GM-% IV SOLR
2.0000 g | Freq: Four times a day (QID) | INTRAVENOUS | Status: AC
  Administered 2015-03-05: 2 g via INTRAVENOUS
  Filled 2015-03-05 (×2): qty 50

## 2015-03-05 MED ORDER — POLYETHYLENE GLYCOL 3350 17 G PO PACK: 17.0000 g | PACK | Freq: Every day | ORAL | Status: DC | PRN

## 2015-03-05 MED ORDER — DIPHENHYDRAMINE HCL 50 MG/ML IJ SOLN
25.0000 mg | Freq: Four times a day (QID) | INTRAMUSCULAR | Status: DC | PRN
  Administered 2015-03-05 – 2015-03-06 (×2): 25 mg via INTRAVENOUS
  Filled 2015-03-05 (×2): qty 1

## 2015-03-05 MED ORDER — METHOCARBAMOL 1000 MG/10ML IJ SOLN
500.0000 mg | Freq: Four times a day (QID) | INTRAVENOUS | Status: DC | PRN
  Administered 2015-03-05 (×2): 500 mg via INTRAVENOUS
  Filled 2015-03-05 (×4): qty 5

## 2015-03-05 MED ORDER — FERROUS SULFATE 325 (65 FE) MG PO TABS
325.0000 mg | ORAL_TABLET | Freq: Three times a day (TID) | ORAL | Status: DC
Start: 2015-03-05 — End: 2015-03-08
  Administered 2015-03-06 – 2015-03-08 (×8): 325 mg via ORAL
  Filled 2015-03-05 (×11): qty 1

## 2015-03-05 MED ORDER — GLYCOPYRROLATE 0.2 MG/ML IJ SOLN
INTRAMUSCULAR | Status: AC
  Filled 2015-03-05: qty 1

## 2015-03-05 SURGICAL SUPPLY — 41 items
BAG ZIPLOCK 12X15 (MISCELLANEOUS) ×3 IMPLANT
BLADE SAW SAG 73X25 THK (BLADE) ×2
BLADE SAW SGTL 73X25 THK (BLADE) ×1 IMPLANT
CAPT HIP HEMI 2 ×3 IMPLANT
CLOSURE STERI-STRIP 1/4X4 (GAUZE/BANDAGES/DRESSINGS) ×3 IMPLANT
CLOSURE WOUND 1/2 X4 (GAUZE/BANDAGES/DRESSINGS)
COUNTER NEEDLE 20 DBL MAG RED (NEEDLE) ×3 IMPLANT
DRAPE INCISE IOBAN 66X45 STRL (DRAPES) ×3 IMPLANT
DRAPE ORTHO SPLIT 77X108 STRL (DRAPES) ×4
DRAPE POUCH INSTRU U-SHP 10X18 (DRAPES) ×3 IMPLANT
DRAPE SURG ORHT 6 SPLT 77X108 (DRAPES) ×2 IMPLANT
DRAPE U-SHAPE 47X51 STRL (DRAPES) ×3 IMPLANT
DRSG ADAPTIC 3X8 NADH LF (GAUZE/BANDAGES/DRESSINGS) ×3 IMPLANT
DRSG EMULSION OIL 3X16 NADH (GAUZE/BANDAGES/DRESSINGS) ×3 IMPLANT
DRSG MEPILEX BORDER 4X4 (GAUZE/BANDAGES/DRESSINGS) IMPLANT
DRSG MEPILEX BORDER 4X8 (GAUZE/BANDAGES/DRESSINGS) ×3 IMPLANT
ELECT REM PT RETURN 9FT ADLT (ELECTROSURGICAL) ×3
ELECTRODE REM PT RTRN 9FT ADLT (ELECTROSURGICAL) ×1 IMPLANT
EVACUATOR 1/8 PVC DRAIN (DRAIN) IMPLANT
GAUZE SPONGE 4X4 12PLY STRL (GAUZE/BANDAGES/DRESSINGS) ×3 IMPLANT
GLOVE ORTHO TXT STRL SZ7.5 (GLOVE) ×3 IMPLANT
GLOVE SURG ORTHO 8.5 STRL (GLOVE) ×3 IMPLANT
GOWN STRL REUS W/TWL LRG LVL3 (GOWN DISPOSABLE) ×6 IMPLANT
IMMOBILIZER KNEE 20 (SOFTGOODS) ×3
IMMOBILIZER KNEE 20 THIGH 36 (SOFTGOODS) ×1 IMPLANT
MANIFOLD NEPTUNE II (INSTRUMENTS) ×3 IMPLANT
PACK TOTAL JOINT (CUSTOM PROCEDURE TRAY) ×3 IMPLANT
PAD ABD 8X10 STRL (GAUZE/BANDAGES/DRESSINGS) ×3 IMPLANT
POSITIONER SURGICAL ARM (MISCELLANEOUS) ×3 IMPLANT
STAPLER VISISTAT 35W (STAPLE) ×3 IMPLANT
STRIP CLOSURE SKIN 1/2X4 (GAUZE/BANDAGES/DRESSINGS) IMPLANT
SUT ETHIBOND NAB CT1 #1 30IN (SUTURE) ×9 IMPLANT
SUT MNCRL AB 4-0 PS2 18 (SUTURE) IMPLANT
SUT VIC AB 1 CT1 36 (SUTURE) ×9 IMPLANT
SUT VIC AB 2-0 CT1 27 (SUTURE) ×6
SUT VIC AB 2-0 CT1 TAPERPNT 27 (SUTURE) ×3 IMPLANT
TAPE CLOTH SURG 4X10 WHT LF (GAUZE/BANDAGES/DRESSINGS) ×3 IMPLANT
TOWEL OR 17X26 10 PK STRL BLUE (TOWEL DISPOSABLE) ×6 IMPLANT
TOWER CARTRIDGE SMART MIX (DISPOSABLE) ×3 IMPLANT
TRAY FOLEY W/METER SILVER 14FR (SET/KITS/TRAYS/PACK) IMPLANT
TRAY FOLEY W/METER SILVER 16FR (SET/KITS/TRAYS/PACK) IMPLANT

## 2015-03-05 NOTE — Anesthesia Preprocedure Evaluation (Addendum)
Anesthesia Evaluation  Patient identified by MRN, date of birth, ID band Patient awake    Reviewed: Allergy & Precautions, H&P , NPO status , Patient's Chart, lab work & pertinent test results  Airway Mallampati: III  TM Distance: >3 FB Neck ROM: Full    Dental no notable dental hx. (+) Teeth Intact, Dental Advisory Given   Pulmonary neg pulmonary ROS,  breath sounds clear to auscultation  Pulmonary exam normal       Cardiovascular negative cardio ROS  Rhythm:Regular Rate:Normal     Neuro/Psych negative neurological ROS  negative psych ROS   GI/Hepatic negative GI ROS, Neg liver ROS,   Endo/Other  negative endocrine ROS  Renal/GU negative Renal ROS  negative genitourinary   Musculoskeletal   Abdominal   Peds  Hematology negative hematology ROS (+)   Anesthesia Other Findings   Reproductive/Obstetrics negative OB ROS                            Anesthesia Physical Anesthesia Plan  ASA: I  Anesthesia Plan: General   Post-op Pain Management:    Induction: Intravenous  Airway Management Planned: Oral ETT  Additional Equipment:   Intra-op Plan:   Post-operative Plan: Extubation in OR  Informed Consent: I have reviewed the patients History and Physical, chart, labs and discussed the procedure including the risks, benefits and alternatives for the proposed anesthesia with the patient or authorized representative who has indicated his/her understanding and acceptance.   Dental advisory given  Plan Discussed with: CRNA  Anesthesia Plan Comments:         Anesthesia Quick Evaluation

## 2015-03-05 NOTE — Discharge Instructions (Signed)
WBAT Right LE  Keep the incision clean and dry for one week, then ok to get wet in the shower  Remember the posterior hip precautions - DO NOT CROSS LEGS, SQUAT DOWN LOW or sit in a very low seat or bring your knee up to your chest for 3 months after surgery.  Sleep with a pillow between your legs   Follow up with Dr Ranell Patrick in two weeks  You will need to be on a blood thinner for 30 days after surgery

## 2015-03-05 NOTE — Consult Note (Signed)
Joanna Jones is an 69 y.o. female.    Chief Complaint: right hip pain  HPI: 69 y/o female with ground level fall yesterday injuring right arm and right hip. Diagnosed with right femoral neck fracture by report, no films, and transferred from [facility]. Denies any LOC or syncope. C/o moderate right hip/leg pain and right elbow/forearm pain. Denies any previous problems with right hip in the past. Lives with husband and walks without aide normally.  PCP:  Purvis Kilts, MD  PMH: History reviewed. No pertinent past medical history.  PSH: History reviewed. No pertinent past surgical history.  Social History:  reports that she has never smoked. She does not have any smokeless tobacco history on file. She reports that she does not drink alcohol. Her drug history is not on file.  Allergies:  Allergies  Allergen Reactions  . Compazine Other (See Comments)    Shot nervous systems  . Sulfa Antibiotics Other (See Comments)    Blisters inside mouth    Medications: Current Facility-Administered Medications  Medication Dose Route Frequency Provider Last Rate Last Dose  . 0.9 %  sodium chloride infusion   Intravenous Continuous Theressa Millard, MD 75 mL/hr at 03/05/15 0352    . acetaminophen (TYLENOL) tablet 650 mg  650 mg Oral Q6H PRN Theressa Millard, MD       Or  . acetaminophen (TYLENOL) suppository 650 mg  650 mg Rectal Q6H PRN Theressa Millard, MD      . alum & mag hydroxide-simeth (MAALOX/MYLANTA) 200-200-20 MG/5ML suspension 30 mL  30 mL Oral Q6H PRN Theressa Millard, MD      . antiseptic oral rinse (CPC / CETYLPYRIDINIUM CHLORIDE 0.05%) solution 7 mL  7 mL Mouth Rinse BID Theressa Millard, MD      . diphenhydrAMINE (BENADRYL) injection 25 mg  25 mg Intravenous Q6H PRN Theressa Millard, MD   25 mg at 03/05/15 0355  . fentaNYL (SUBLIMAZE) injection 100 mcg  100 mcg Intravenous Q2H PRN Theressa Millard, MD   100 mcg at 03/05/15 0751  . LORazepam (ATIVAN) injection  0.5 mg  0.5 mg Intravenous Q6H Brad Alexsandra Shontz, PA-C      . ondansetron (ZOFRAN) tablet 4 mg  4 mg Oral Q6H PRN Theressa Millard, MD       Or  . ondansetron (ZOFRAN) injection 4 mg  4 mg Intravenous Q6H PRN Theressa Millard, MD      . oxyCODONE (Oxy IR/ROXICODONE) immediate release tablet 5 mg  5 mg Oral Q4H PRN Theressa Millard, MD   5 mg at 03/05/15 0315    Results for orders placed or performed during the hospital encounter of 03/05/15 (from the past 48 hour(s))  Surgical pcr screen     Status: None   Collection Time: 03/05/15  4:13 AM  Result Value Ref Range   MRSA, PCR NEGATIVE NEGATIVE   Staphylococcus aureus NEGATIVE NEGATIVE    Comment:        The Xpert SA Assay (FDA approved for NASAL specimens in patients over 76 years of age), is one component of a comprehensive surveillance program.  Test performance has been validated by [facility] for patients greater than or equal to 72 year old. It is not intended to diagnose infection nor to guide or monitor treatment.   Basic metabolic panel     Status: Abnormal   Collection Time: 03/05/15  5:20 AM  Result Value Ref Range   Sodium 139 135 -  145 mmol/L   Potassium 3.6 3.5 - 5.1 mmol/L   Chloride 108 101 - 111 mmol/L   CO2 25 22 - 32 mmol/L   Glucose, Bld 109 (H) 65 - 99 mg/dL   BUN 8 6 - 20 mg/dL   Creatinine, Ser 0.52 0.44 - 1.00 mg/dL   Calcium 8.5 (L) 8.9 - 10.3 mg/dL   GFR calc non Af Amer >60 >60 mL/min   GFR calc Af Amer >60 >60 mL/min    Comment: (NOTE) The eGFR has been calculated using the CKD EPI equation. This calculation has not been validated in all clinical situations. eGFR's persistently <60 mL/min signify possible Chronic Kidney Disease.    Anion gap 6 5 - 15  CBC     Status: Abnormal   Collection Time: 03/05/15  5:20 AM  Result Value Ref Range   WBC 5.3 4.0 - 10.5 K/uL   RBC 4.14 3.87 - 5.11 MIL/uL   Hemoglobin 12.0 12.0 - 15.0 g/dL   HCT 36.7 36.0 - 46.0 %   MCV 88.6 78.0 - 100.0 fL   MCH 29.0  26.0 - 34.0 pg   MCHC 32.7 30.0 - 36.0 g/dL   RDW 12.6 11.5 - 15.5 %   Platelets 138 (L) 150 - 400 K/uL   No results found.  ROS: ROS Unable to bear weight right lower extremity Hard of hearing Right arm pain with movement  Physical Exam: Alert and appropriate 69 y/o female in no acute distress Cervical spine with full rom no tenderness Right arm with no deformity or sign of injury, localizes pain to forearm area nv intact distally Right lower extremity with minimal external rotation and shortening nv intact distally Left lower extremity full rom no tenderness  Physical Exam   Assessment/Plan Assessment: right femur fracture  Plan: Will plan to get x-rays to verify the correct diagnosis Plan for surgery later today for right femur fracture Keep NPO Strict bedrest Pain control and anxiety control as needed

## 2015-03-05 NOTE — Anesthesia Postprocedure Evaluation (Signed)
  Anesthesia Post-op Note  Patient: Joanna Jones  Procedure(s) Performed: Procedure(s): ARTHROPLASTY BIPOLAR HIP (HEMIARTHROPLASTY) (Right)  Patient Location: PACU  Anesthesia Type:General  Level of Consciousness: awake and alert   Airway and Oxygen Therapy: Patient Spontanous Breathing  Post-op Pain: Controlled  Post-op Assessment: Post-op Vital signs reviewed, Patient's Cardiovascular Status Stable and Respiratory Function Stable  Post-op Vital Signs: Reviewed  Filed Vitals:   03/05/15 1300  BP: 153/57  Pulse: 84  Temp:   Resp: 12    Complications: No apparent anesthesia complications

## 2015-03-05 NOTE — Transfer of Care (Signed)
Immediate Anesthesia Transfer of Care Note  Patient: Joanna Jones  Procedure(s) Performed: Procedure(s): ARTHROPLASTY BIPOLAR HIP (HEMIARTHROPLASTY) (Right)  Patient Location: PACU  Anesthesia Type:General  Level of Consciousness: awake, alert  and patient cooperative  Airway & Oxygen Therapy: Patient Spontanous Breathing and Patient connected to face mask oxygen  Post-op Assessment: Report given to RN and Post -op Vital signs reviewed and stable  Post vital signs: Reviewed and stable  Last Vitals:  Filed Vitals:   03/05/15 0900  BP: 144/53  Pulse: 78  Temp: 37.2 C  Resp:     Complications: No apparent anesthesia complications

## 2015-03-05 NOTE — Op Note (Signed)
Joanna Jones, Joanna Jones               ACCOUNT NO.:  [id number]  MEDICAL RECORD NO.:  [id number]  LOCATION:  [facility]                         FACILITY:  [facility]  PHYSICIAN:  Almedia Balls. Ranell Patrick, M.D. DATE OF BIRTH:  1945/08/04  DATE OF PROCEDURE:  03/05/2015 DATE OF DISCHARGE:                              OPERATIVE REPORT   PREOPERATIVE DIAGNOSIS:  Displaced right femoral neck fracture.  POSTOPERATIVE DIAGNOSIS:  Displaced right femoral neck fracture.  PROCEDURE PERFORMED:  Right hip hemiarthroplasty using DePuy Tri-Lock monopolar hip system.  ATTENDING SURGEON:  Almedia Balls. Ranell Patrick, M.D.  ASSISTANT:  Donnie Coffin. Dixon, PA-C, who scrubbed the entire procedure and necessary for satisfactory completion of surgery.  ANESTHESIA:  General anesthesia was used.  ESTIMATED BLOOD LOSS:  Less than 200 mL.  FLUID REPLACED:  1500 mL crystalloids.  INSTRUMENT COUNTS:  Correct.  COMPLICATIONS:  There were no complications.  ANTIBIOTICS:  Perioperative antibiotics were given.  INDICATIONS:  The patient is a 69 year old female, who presents with history of a mechanical fall tripping over a dog gate.  The patient complained of immediate severe pain in the right hip, unable to ambulate.  The patient presented initially to [facility] where x-rays were obtained demonstrating a displaced femoral neck fracture. The patient was transported down to [facility] for further definitive care of her hip.  I discussed with the patient, the need for right hip hemiarthroplasty as the patient has had no evidence of any arthritis in that right hip and recommended hemiarthroplasty to re- create her proximal femoral anatomy and not allow her to ambulate.  The patient agreed.  Informed consent obtained.  DESCRIPTION OF PROCEDURE:  After an adequate level of anesthesia was achieved, the patient was positioned in the supine position.  She was then moved into the left lateral decubitus position with the  right hip up, down leg padded appropriately.  Axillary roll was utilized.  All neurovascular structures padded.  Once she was secured, time-out was called.  We did a sterile prep and drape of the right hip.  We then went ahead and entered the hip through a lateral posterior Kocher-Langenbeck approach, started at the vastus ridge and extended along the gluteus maximus course posteriorly and proximally.  Dissection down through the subcutaneous tissues using Bovie, identified the tensor fascia lata and divided that in line with skin incision.  We then identified short external rotators, took those off the posterior aspect of the femur. Progressively, internally rotating the hip, we retracted the gluteus medius.  We then released the piriformis as well.  The hip was acutely fractured.  We first did our neck resection one fingerbreadth above the lesser trochanter with an oscillating saw using the neck resection guide.  We then went ahead and used a corkscrew to remove the femoral head, which was sized appropriately to the sizing guide.  We inspected the acetabulum, which was in good shape.  No significant cartilage wear was noted.  The acetabular labrum was completely intact.  We removed the ligamentum teres using the Bovie.  We then irrigated thoroughly.  Next, we prepared our femur with a broach only system for the DePuy Tri-Lock system.  We used our  star reamer and canal initiator and then went ahead and started broaching up to a size 4 Tri-Lock stem standard offset. Once we had that done, it was about 25 degrees of anteversion.  Once, we had that impacted in place, we trialed with +0 neck and an appropriate sized head ball.  We reduced the head into place and then ranged the hip.  We had leg lengths, which were symmetric and a nice stable hip in extension and in flexion and with the hip internally rotated 90 degrees. We removed the trial implants, irrigated thoroughly and then placed  real Tri-Lock size 4 standard offset stem in place in appropriate anteversion, impacted that in position, selected the real +0 adapter and then impacted that into the appropriate size monopolar head.  We then reduced that on the trunnion, impacted that and reduced the hip.  We next irrigated the hip thoroughly.  We repaired the capsule with interrupted #1 Ethibond suture followed by repair of the piriformis back to the gluteus medius there at the trochanter with #1 Ethibond and then we did a #1 Ethibond and #1 Vicryl closure of the tensor fascia lata with interrupted sutures for the tendinous portion and then a running suture for the myotendinous area.  We then did a layer of subcutaneous closure with 0 and 2-0 Vicryl followed by 4-0 Monocryl for skin.  Steri- Strips and sterile dressing applied.  The patient transported to the recovery room in stable condition, having tolerated surgery well.     Almedia Balls. Ranell Patrick, M.D.     SRN/MEDQ  D:  03/05/2015  T:  03/05/2015  Job:  [id number]

## 2015-03-05 NOTE — Anesthesia Procedure Notes (Signed)
Procedure Name: Intubation Date/Time: 03/05/2015 10:10 AM Performed by: Delphia Grates Pre-anesthesia Checklist: Emergency Drugs available, Patient identified, Suction available and Patient being monitored Patient Re-evaluated:Patient Re-evaluated prior to inductionOxygen Delivery Method: Circle system utilized Preoxygenation: Pre-oxygenation with 100% oxygen Intubation Type: IV induction Laryngoscope Size: Mac and 4 Grade View: Grade I Tube type: Oral Tube size: 7.5 mm Number of attempts: 1 Airway Equipment and Method: Stylet Placement Confirmation: ETT inserted through vocal cords under direct vision,  breath sounds checked- equal and bilateral and positive ETCO2 Secured at: 21 cm Tube secured with: Tape Dental Injury: Teeth and Oropharynx as per pre-operative assessment

## 2015-03-05 NOTE — Progress Notes (Signed)
[facility] PROGRESS NOTE  Joanna Jones UJW:[id number] DOB: Nov 17, 1945 DOA: 03/05/2015 PCP: Colette Ribas, MD  Assessment/Plan: 1. Displaced right femoral neck fracture. -Patient having mechanical fall at home when she tripped over her dog and fell on her right side. -Orthopedic surgery was consulted and she was taken to the oral on 03/05/2015 undergoing right hip hemiarthroplasty -She tolerated procedure well no immediate complications -Physical therapy consultation -She is on Lovenox 40 mg subcutaneous daily for DVT prophylaxis.  2.  Pain management. -Patient on Norco 5/325 every 6 hours as needed along with Dilaudid 0.5 mg every 2 hours as needed for severe breakthrough pain.  Code Status: Full Code Family Communication:  Disposition Plan: Likely require SNF placement   Consultants:  Ortho  Procedures:  Right hemiarthroplasty   HPI/Subjective: Patient is a pleasant 69 year old female who was admitted to medicine service on 6 03/05/2015 presented after having a fall at home that resulted in a right hip fracture. She was initially taken to [facility] where imaging studies revealed a right femoral neck fracture. Patient was transferred to [facility] where she was evaluated by Dr. Ranell Patrick of orthopedic surgery. Patient was taken to the OR on 03/05/2015 undergoing right hip hemiarthroplasty. Patient tolerated procedure well there no immediate competitions.  Objective: Filed Vitals:   03/05/15 1829  BP: 139/59  Pulse: 70  Temp: 98.4 F (36.9 C)  Resp: 18    Intake/Output Summary (Last 24 hours) at 03/05/15 1843 Last data filed at 03/05/15 1800  Gross per 24 hour  Intake   1640 ml  Output   1125 ml  Net    515 ml   Filed Weights   03/05/15 0300  Weight: 68.947 kg (152 lb)    Exam:   General:  Patient is awake and alert, sitting after surgery. She is no acute distress  Cardiovascular: Regular rate rhythm normal S1-S2  Respiratory:  Normal respiratory effort  Abdomen: Soft nontender nondistended  Musculoskeletal: No extremity edema  Data Reviewed: Basic Metabolic Panel:  Recent Labs Lab 03/05/15 0520  NA 139  K 3.6  CL 108  CO2 25  GLUCOSE 109*  BUN 8  CREATININE 0.52  CALCIUM 8.5*   Liver Function Tests: No results for input(s): AST, ALT, ALKPHOS, BILITOT, PROT, ALBUMIN in the last 168 hours. No results for input(s): LIPASE, AMYLASE in the last 168 hours. No results for input(s): AMMONIA in the last 168 hours. CBC:  Recent Labs Lab 03/05/15 0520  WBC 5.3  HGB 12.0  HCT 36.7  MCV 88.6  PLT 138*   Cardiac Enzymes: No results for input(s): CKTOTAL, CKMB, CKMBINDEX, TROPONINI in the last 168 hours. BNP (last 3 results) No results for input(s): BNP in the last 8760 hours.  ProBNP (last 3 results) No results for input(s): PROBNP in the last 8760 hours.  CBG: No results for input(s): GLUCAP in the last 168 hours.  Recent Results (from the past 240 hour(s))  Surgical pcr screen     Status: None   Collection Time: 03/05/15  4:13 AM  Result Value Ref Range Status   MRSA, PCR NEGATIVE NEGATIVE Final   Staphylococcus aureus NEGATIVE NEGATIVE Final    Comment:        The Xpert SA Assay (FDA approved for NASAL specimens in patients over 56 years of age), is one component of a comprehensive surveillance program.  Test performance has been validated by [employer] for patients greater than or equal to 64 year old. It is  not intended to diagnose infection nor to guide or monitor treatment.      Studies: Dg Forearm Right  03/05/2015   CLINICAL DATA:  Intra op right forearm to r/o fx  EXAM: RIGHT FOREARM - 2 VIEW  COMPARISON:  03/05/2015 at 8:36 a.m.  FINDINGS: No fracture. Elbow and wrist joints are normally aligned. Bones are demineralized. Soft tissues are unremarkable.  IMPRESSION: No fracture or dislocation.   Electronically Signed   By: Amie Portland M.D.   On: 03/05/2015 12:27   Dg  Forearm Right  03/05/2015   CLINICAL DATA:  69 year old female with history of trauma from a fall complaining of right forearm pain.  EXAM: RIGHT FOREARM - 2 VIEW  COMPARISON:  No priors.  FINDINGS: There is no evidence of fracture or other focal bone lesions. Soft tissues are unremarkable.  IMPRESSION: Negative.   Electronically Signed   By: Trudie Reed M.D.   On: 03/05/2015 09:36   Pelvis Portable  03/05/2015   CLINICAL DATA:  69 year old female status post right hip arthroplasty earlier today.  EXAM: PORTABLE PELVIS 1-2 VIEWS  COMPARISON:  03/05/2015.  FINDINGS: Interval right hip bipolar hemiarthroplasty. No periprosthetic fracture adjacent to the femoral stem of the right prosthesis. The prosthetic femoral head appears located in the acetabulum on these AP views. Bony pelvis and visualized portions of the left proximal femur are intact. There is a small amount of gas in the soft tissues lateral to the right hip joint.  IMPRESSION: 1. Status post right hip bipolar hemiarthroplasty, without immediate complicating features, as above.   Electronically Signed   By: Trudie Reed M.D.   On: 03/05/2015 12:59   Dg Pelvis Portable  03/05/2015   CLINICAL DATA:  69 year old female with history of trauma from a fall complaining of right-sided hip pain.  EXAM: PORTABLE PELVIS 1-2 VIEWS  COMPARISON:  No priors.  FINDINGS: Minimally displaced right subcapital femoral neck fracture. Bony pelvis appears intact, as does the visualized portions of the left proximal femur. Right femoral head remains located.  IMPRESSION: 1. Minimally displaced subcapital fracture of the right femoral neck.   Electronically Signed   By: Trudie Reed M.D.   On: 03/05/2015 09:51   Dg Humerus Right  03/05/2015   CLINICAL DATA:  Intra op portable right humerus to r/o fx;  EXAM: RIGHT HUMERUS - 2+ VIEW  COMPARISON:  03/04/2015.  FINDINGS: No fracture or dislocation.  No bone lesion.  IMPRESSION: Negative.   Electronically Signed   By:  Amie Portland M.D.   On: 03/05/2015 12:24    Scheduled Meds: . antiseptic oral rinse  7 mL Mouth Rinse BID  .  ceFAZolin (ANCEF) IV  2 g Intravenous Q6H  . docusate sodium  100 mg Oral BID  . [START ON 03/06/2015] enoxaparin (LOVENOX) injection  40 mg Subcutaneous Q24H  . estrogens (conjugated)  0.625 mg Oral Daily  . ferrous sulfate  325 mg Oral TID PC  . HYDROmorphone       Continuous Infusions: . sodium chloride 75 mL/hr at 03/05/15 1528    Principal Problem:   Hip fracture Active Problems:   Hip fracture requiring operative repair    Time spent:     Jeralyn Bennett  [facility] Pager [phone]. If 7PM-7AM, please contact night-coverage at [email or web address], password [email or web address] 03/05/2015, 6:43 PM  LOS: 0 days

## 2015-03-05 NOTE — Brief Op Note (Signed)
03/05/2015  11:43 AM  PATIENT:  Royetta Asal  69 y.o. female  PRE-OPERATIVE DIAGNOSIS:  right femoral neck fracture, displaced POST-OPERATIVE DIAGNOSIS:  right femoral neck fracture, displaced  PROCEDURE:  Procedure(s): ARTHROPLASTY BIPOLAR HIP (HEMIARTHROPLASTY) (Right) DePuy TriLoc  SURGEON:  Surgeon(s) and Role:    * Beverely Low, MD - Primary  PHYSICIAN ASSISTANT:   ASSISTANTS: Thea Gist, PA-C   ANESTHESIA:   general  EBL:  Total I/O In: 1000 [I.V.:1000] Out: 625 [Urine:325; Blood:300]  BLOOD ADMINISTERED:none  DRAINS: none   LOCAL MEDICATIONS USED:  NONE  SPECIMEN:  No Specimen  DISPOSITION OF SPECIMEN:  N/A  COUNTS:  YES  TOURNIQUET:  * No tourniquets in log *  DICTATION: .Other Dictation: Dictation Number [id number]  PLAN OF CARE: Admit to inpatient   PATIENT DISPOSITION:  PACU - hemodynamically stable.   Delay start of Pharmacological VTE agent (>24hrs) due to surgical blood loss or risk of bleeding: no

## 2015-03-05 NOTE — H&P (Signed)
[facility] Admission History and Physical       Joanna Jones XBJ:[id number] DOB: 1946-03-07 DOA: 03/05/2015  Referring physician: EDP PCP: Colette Ribas, MD  Specialists:   Chief Complaint: Right Hip and Right Arm Pain after Falling  HPI: Joanna Jones is a 69 y.o. female who tripped over her dog gate and fell onto her right side injuring her right hip and right arm who was taken to the [facility] and found to have an acute Right Femoral Neck Fracture  On X-ray.   Orthopedic Surgery Dr Roswell Miners was consulted and arrangements were made for transfer to [facility].     Review of Systems: Constitutional: No Weight Loss, No Weight Gain, Night Sweats, Fevers, Chills, Dizziness, Light Headedness, Fatigue, or Generalized Weakness HEENT: No Headaches, Difficulty Swallowing,Tooth/Dental Problems,Sore Throat,  No Sneezing, Rhinitis, Ear Ache, Nasal Congestion, or Post Nasal Drip,  Cardio-vascular:  No Chest pain, Orthopnea, PND, Edema in Lower Extremities, Anasarca, Dizziness, Palpitations  Resp: No Dyspnea, No DOE, No Productive Cough, No Non-Productive Cough, No Hemoptysis, No Wheezing.    GI: No Heartburn, Indigestion, Abdominal Pain, Nausea, Vomiting, Diarrhea, Constipation, Hematemesis, Hematochezia, Melena, Change in Bowel Habits,  Loss of Appetite  GU: No Dysuria, No Change in Color of Urine, No Urgency or Urinary Frequency, No Flank pain.  Musculoskeletal: +Pain  In Right Hip and Right ARM; with Decreased Range of Motion of Right Hip, No Back Pain.  Neurologic: No Syncope, No Seizures, Muscle Weakness, Paresthesia, Vision Disturbance or Loss, No Diplopia, No Vertigo, No Difficulty Walking,  Skin: No Rash or Lesions. Psych: No Change in Mood or Affect, No Depression or Anxiety, No Memory loss, No Confusion, or Hallucinations   No past medical history on file.   No past surgical history on file.    Prior to Admission medications   Medication Sig  Start Date End Date Taking? Authorizing Provider  Calcium-Magnesium-Vitamin D (CALCIUM 1200+D3 PO) Take 1 tablet by mouth daily.   Yes Historical Provider, MD  estrogens, conjugated, (PREMARIN) 1.25 MG tablet Take 0.625 mg by mouth daily.   Yes Historical Provider, MD  LORazepam (ATIVAN) 0.5 MG tablet Take 0.5 mg by mouth every 8 (eight) hours as needed for anxiety.   Yes Historical Provider, MD  polyvinyl alcohol (ARTIFICIAL TEARS) 1.4 % ophthalmic solution Place 1 drop into both eyes 4 (four) times daily as needed for dry eyes.   Yes Historical Provider, MD     Allergies  Allergen Reactions  . Compazine Other (See Comments)    Shot nervous systems  . Sulfa Antibiotics Other (See Comments)    Blisters inside mouth    Social History:  reports that she has never smoked. She does not have any smokeless tobacco history on file. She reports that she does not drink alcohol. Her drug history is not on file.    Family History  Problem Relation Age of Onset  . Diabetes Mellitus II Mother   . CAD Father   . Hypertension Father        Physical Exam:  GEN:  Pleasant Well Nourished and Well Developed  69 y.o. Caucasian female examined and in no acute distress; cooperative with exam Filed Vitals:   03/05/15 0030  BP: 144/56  Pulse: 75  Temp: 98.1 F (36.7 C)  TempSrc: Oral  Resp: 18  SpO2: 96%   Blood pressure 144/56, pulse 75, temperature 98.1 F (36.7 C), temperature source Oral, resp. rate 18, SpO2 96 %. PSYCH: She is  alert and oriented x4; does not appear anxious does not appear depressed; affect is normal HEENT: Normocephalic and Atraumatic, Mucous membranes pink; PERRLA; EOM intact; Fundi:  Benign;  No scleral icterus, Nares: Patent, Oropharynx: Clear, Fair Dentition,    Neck:  FROM, No Cervical Lymphadenopathy nor Thyromegaly or Carotid Bruit; No JVD; Breasts:: Not examined CHEST WALL: No tenderness CHEST: Normal respiration, clear to auscultation bilaterally HEART: Regular  rate and rhythm; no murmurs rubs or gallops BACK: No kyphosis or scoliosis; No CVA tenderness ABDOMEN: Positive Bowel Sounds, Soft Non-Tender, No Rebound or Guarding; No Masses, No Organomegaly, Rectal Exam: Not done EXTREMITIES: No Cyanosis, Clubbing, or Edema; No Ulcerations. Genitalia: not examined PULSES: 2+ and symmetric SKIN: Normal hydration no rash or ulceration CNS:  Alert and Oriented x 4, No Focal Deficits Vascular: pulses palpable throughout    Labs on Admission:  Basic Metabolic Panel: No results for input(s): NA, K, CL, CO2, GLUCOSE, BUN, CREATININE, CALCIUM, MG, PHOS in the last 168 hours. Liver Function Tests: No results for input(s): AST, ALT, ALKPHOS, BILITOT, PROT, ALBUMIN in the last 168 hours. No results for input(s): LIPASE, AMYLASE in the last 168 hours. No results for input(s): AMMONIA in the last 168 hours. CBC: No results for input(s): WBC, NEUTROABS, HGB, HCT, MCV, PLT in the last 168 hours. Cardiac Enzymes: No results for input(s): CKTOTAL, CKMB, CKMBINDEX, TROPONINI in the last 168 hours.  BNP (last 3 results) No results for input(s): BNP in the last 8760 hours.  ProBNP (last 3 results) No results for input(s): PROBNP in the last 8760 hours.  CBG: No results for input(s): GLUCAP in the last 168 hours.  Radiological Exams on Admission: No results found.    Assessment/Plan:   69 y.o. female with     1.   Hip fracture   Orthopedics to see in AM (Dr Despina Hick)   PRN IV Fentanyl for Pain      Active Problems:     2.    DVT Prophylaxis   SCDs        Code Status:     FULL CODE        Family Communication:   Family at Bedside       Disposition Plan:    Inpatient Status        Time spent:  14 Minutes      Ron Parker [facility] Pager [phone]   If 7AM -7PM Please Contact the Day Rounding Team MD for [facility]  If 7PM-7AM, Please Contact Night-Floor Coverage  [email or web address] Password [email or web address] 03/05/2015, 2:41 AM      ADDENDUM:   Patient was seen and examined on 03/05/2015

## 2015-03-06 DIAGNOSIS — E876 Hypokalemia: Secondary | ICD-10-CM

## 2015-03-06 LAB — CBC
HCT: 31.9 % — ABNORMAL LOW (ref 36.0–46.0)
HEMOGLOBIN: 10.5 g/dL — AB (ref 12.0–15.0)
MCH: 29.7 pg (ref 26.0–34.0)
MCHC: 32.9 g/dL (ref 30.0–36.0)
MCV: 90.1 fL (ref 78.0–100.0)
PLATELETS: 133 10*3/uL — AB (ref 150–400)
RBC: 3.54 MIL/uL — AB (ref 3.87–5.11)
RDW: 12.6 % (ref 11.5–15.5)
WBC: 6 10*3/uL (ref 4.0–10.5)

## 2015-03-06 LAB — BASIC METABOLIC PANEL
Anion gap: 5 (ref 5–15)
BUN: 7 mg/dL (ref 6–20)
CO2: 24 mmol/L (ref 22–32)
Calcium: 7.8 mg/dL — ABNORMAL LOW (ref 8.9–10.3)
Chloride: 108 mmol/L (ref 101–111)
Creatinine, Ser: 0.6 mg/dL (ref 0.44–1.00)
GFR calc non Af Amer: >60 mL/min (ref >60)
Glucose, Bld: 140 mg/dL — ABNORMAL HIGH (ref 65–99)
Potassium: 3.3 mmol/L — ABNORMAL LOW (ref 3.5–5.1)
Sodium: 137 mmol/L (ref 135–145)

## 2015-03-06 MED ORDER — POTASSIUM CHLORIDE CRYS ER 20 MEQ PO TBCR
40.0000 meq | EXTENDED_RELEASE_TABLET | Freq: Once | ORAL | Status: AC
  Administered 2015-03-06: 40 meq via ORAL
  Filled 2015-03-06: qty 2

## 2015-03-06 NOTE — Clinical Social Work Note (Signed)
Clinical Social Work Assessment  Patient Details  Name: Joanna Jones MRN: [id number] Date of Birth: [birth year]  Date of referral:  03/06/15               Reason for consult:  Facility Placement                Permission sought to share information with:    Permission granted to share information::  No  Name::        Agency::     Relationship::     Contact Information:     Housing/Transportation Living arrangements for the past 2 months:  Single Family Home Source of Information:  Patient, Spouse Patient Interpreter Needed:  None Criminal Activity/Legal Involvement Pertinent to Current Situation/Hospitalization:    Significant Relationships:    Lives with:  Adult Children, Spouse, Siblings Do you feel safe going back to the place where you live?  Yes Need for family participation in patient care:  Yes (Comment)  Care giving concerns:  Pt's husband is concerned that he will not be able to care for pt at home but pt refusing to go to SNF   Social Worker assessment / plan:  CSW met with pt, her husband, pt's sister and son at bedside today.  CSW presented SNF options and encouraged pt and family to discuss history, needs and feelings related to rehab.  CSW provided information about SNF's and explained protocol with sending information.  CSW was not given permission to fax out and went back to pt's room twice today to see if she would change her mind. CSW unable to fax out and pt wants in home [facility] services  Employment status:  Home[employer] Insurance information:  Managed Care PT Recommendations:  Not assessed at this time Information / Referral to community resources:     Patient/Family's Response to care:  Pt apprehensive about going to SNF and continued to stated that she wanted to go home.  Pt's sister agreed that pt could go home and she would make sure it worked out ok for pt.    Patient/Family's Understanding of and Emotional Response to Diagnosis, Current Treatment, and  Prognosis:  Pt unsure what she will be able to do for herself at home but stated she felt family would help her out with her decision to go home instead of SNF  Emotional Assessment Appearance:  Appears stated age Attitude/Demeanor/Rapport:  Lethargic, Apprehensive Affect (typically observed):  Agitated, Tearful/Crying Orientation:  Oriented to Self, Oriented to Place, Oriented to  Time, Oriented to Situation Alcohol / Substance use:    Psych involvement (Current and /or in the community):     Discharge Needs  Concerns to be addressed:    Readmission within the last 30 days:    Current discharge risk:    Barriers to Discharge:  No Barriers Identified   Carlean Jews, LCSW 03/06/2015, 5:08 PM

## 2015-03-06 NOTE — Progress Notes (Addendum)
[facility] PROGRESS NOTE  Joanna Jones [id number]:[id number] DOB: 1945/08/26 DOA: 03/05/2015 PCP: Colette Ribas, MD  Assessment/Plan: 1. Displaced right femoral neck fracture. -Patient having mechanical fall at home when she tripped over her dog and fell on her right side. -Orthopedic surgery was consulted and she was taken to the oral on 03/05/2015 undergoing right hip hemiarthroplasty -She tolerated procedure well no immediate complications -She is on Lovenox 40 mg subcutaneous daily for DVT prophylaxis. -Repeat labs showing hemoglobin of 10.5  2.  Hypokalemia -A.m. labs showing potassium of 3.3, will replace with oral potassium  3.  Pain management. -Patient on Norco 5/325 every 6 hours as needed along with Dilaudid 0.5 mg every 2 hours as needed for severe breakthrough pain.  Code Status: Full Code Family Communication:  Disposition Plan: Likely require SNF placement   Consultants:  Ortho  Procedures:  Right hemiarthroplasty   HPI/Subjective: Patient is a pleasant 69 year old female who was admitted to medicine service on 6 03/05/2015 presented after having a fall at home that resulted in a right hip fracture. She was initially taken to [facility] where imaging studies revealed a right femoral neck fracture. Patient was transferred to [facility] where she was evaluated by Dr. Ranell Patrick of orthopedic surgery. Patient was taken to the OR on 03/05/2015 undergoing right hip hemiarthroplasty. Patient tolerated procedure well there no immediate competitions.  Objective: Filed Vitals:   03/06/15 1430  BP: 128/69  Pulse: 79  Temp: 98.8 F (37.1 C)  Resp: 18    Intake/Output Summary (Last 24 hours) at 03/06/15 1509 Last data filed at 03/06/15 1400  Gross per 24 hour  Intake   2280 ml  Output   2750 ml  Net   -470 ml   Filed Weights   03/05/15 0300  Weight: 68.947 kg (152 lb)    Exam:   General:  Patient is awake and alert, sitting after  surgery. She is no acute distress  Cardiovascular: Regular rate rhythm normal S1-S2  Respiratory: Normal respiratory effort  Abdomen: Soft nontender nondistended  Musculoskeletal: No extremity edema  Skin: Surgical incision site was inspected, there is dried blood over the area however no evidence of active bleeding.   Data Reviewed: Basic Metabolic Panel:  Recent Labs Lab 03/05/15 0520 03/06/15 0520  NA 139 137  K 3.6 3.3*  CL 108 108  CO2 25 24  GLUCOSE 109* 140*  BUN 8 7  CREATININE 0.52 0.60  CALCIUM 8.5* 7.8*   Liver Function Tests: No results for input(s): AST, ALT, ALKPHOS, BILITOT, PROT, ALBUMIN in the last 168 hours. No results for input(s): LIPASE, AMYLASE in the last 168 hours. No results for input(s): AMMONIA in the last 168 hours. CBC:  Recent Labs Lab 03/05/15 0520 03/06/15 0520  WBC 5.3 6.0  HGB 12.0 10.5*  HCT 36.7 31.9*  MCV 88.6 90.1  PLT 138* 133*   Cardiac Enzymes: No results for input(s): CKTOTAL, CKMB, CKMBINDEX, TROPONINI in the last 168 hours. BNP (last 3 results) No results for input(s): BNP in the last 8760 hours.  ProBNP (last 3 results) No results for input(s): PROBNP in the last 8760 hours.  CBG: No results for input(s): GLUCAP in the last 168 hours.  Recent Results (from the past 240 hour(s))  Surgical pcr screen     Status: None   Collection Time: 03/05/15  4:13 AM  Result Value Ref Range Status   MRSA, PCR NEGATIVE NEGATIVE Final   Staphylococcus aureus NEGATIVE NEGATIVE Final  Comment:        The Xpert SA Assay (FDA approved for NASAL specimens in patients over 85 years of age), is one component of a comprehensive surveillance program.  Test performance has been validated by [facility] for patients greater than or equal to 17 year old. It is not intended to diagnose infection nor to guide or monitor treatment.      Studies: Dg Forearm Right  03/05/2015   CLINICAL DATA:  Intra op right forearm to r/o fx   EXAM: RIGHT FOREARM - 2 VIEW  COMPARISON:  03/05/2015 at 8:36 a.m.  FINDINGS: No fracture. Elbow and wrist joints are normally aligned. Bones are demineralized. Soft tissues are unremarkable.  IMPRESSION: No fracture or dislocation.   Electronically Signed   By: Amie Portland M.D.   On: 03/05/2015 12:27   Dg Forearm Right  03/05/2015   CLINICAL DATA:  69 year old female with history of trauma from a fall complaining of right forearm pain.  EXAM: RIGHT FOREARM - 2 VIEW  COMPARISON:  No priors.  FINDINGS: There is no evidence of fracture or other focal bone lesions. Soft tissues are unremarkable.  IMPRESSION: Negative.   Electronically Signed   By: Trudie Reed M.D.   On: 03/05/2015 09:36   Pelvis Portable  03/05/2015   CLINICAL DATA:  69 year old female status post right hip arthroplasty earlier today.  EXAM: PORTABLE PELVIS 1-2 VIEWS  COMPARISON:  03/05/2015.  FINDINGS: Interval right hip bipolar hemiarthroplasty. No periprosthetic fracture adjacent to the femoral stem of the right prosthesis. The prosthetic femoral head appears located in the acetabulum on these AP views. Bony pelvis and visualized portions of the left proximal femur are intact. There is a small amount of gas in the soft tissues lateral to the right hip joint.  IMPRESSION: 1. Status post right hip bipolar hemiarthroplasty, without immediate complicating features, as above.   Electronically Signed   By: Trudie Reed M.D.   On: 03/05/2015 12:59   Dg Pelvis Portable  03/05/2015   CLINICAL DATA:  69 year old female with history of trauma from a fall complaining of right-sided hip pain.  EXAM: PORTABLE PELVIS 1-2 VIEWS  COMPARISON:  No priors.  FINDINGS: Minimally displaced right subcapital femoral neck fracture. Bony pelvis appears intact, as does the visualized portions of the left proximal femur. Right femoral head remains located.  IMPRESSION: 1. Minimally displaced subcapital fracture of the right femoral neck.   Electronically Signed    By: Trudie Reed M.D.   On: 03/05/2015 09:51   Dg Humerus Right  03/05/2015   CLINICAL DATA:  Intra op portable right humerus to r/o fx;  EXAM: RIGHT HUMERUS - 2+ VIEW  COMPARISON:  03/04/2015.  FINDINGS: No fracture or dislocation.  No bone lesion.  IMPRESSION: Negative.   Electronically Signed   By: Amie Portland M.D.   On: 03/05/2015 12:24    Scheduled Meds: . antiseptic oral rinse  7 mL Mouth Rinse BID  . docusate sodium  100 mg Oral BID  . enoxaparin (LOVENOX) injection  40 mg Subcutaneous Q24H  . estrogens (conjugated)  0.625 mg Oral Daily  . ferrous sulfate  325 mg Oral TID PC   Continuous Infusions: . sodium chloride 75 mL/hr at 03/06/15 0425    Principal Problem:   Hip fracture Active Problems:   Hip fracture requiring operative repair    Time spent: 25 min    Jeralyn Bennett  [facility] Pager [phone]. If 7PM-7AM, please contact night-coverage at [email or web address], password  [email or web address] 03/06/2015, 3:09 PM  LOS: 1 day

## 2015-03-06 NOTE — Progress Notes (Signed)
Physical Therapy Treatment Patient Details Name: Joanna Jones MRN: [id number] DOB: Mar 07, 1946 Today's Date: 03/06/2015    History of Present Illness pt is s/p R hemiarthroplasty after a fall    PT Comments    Pt continues ltd by anxiety but progressing slowly with mobility.    Follow Up Recommendations  Home health PT;SNF     Equipment Recommendations  Rolling walker with 5" wheels    Recommendations for Other Services OT consult     Precautions / Restrictions Precautions Precautions: Posterior Hip Precaution Booklet Issued: Yes (comment) Precaution Comments: pt recalls 2/3 THP Restrictions Weight Bearing Restrictions: No Other Position/Activity Restrictions: WBAT    Mobility  Bed Mobility Overal bed mobility: +2 for physical assistance;Needs Assistance Bed Mobility: Sit to Supine     Supine to sit: Min assist;Mod assist;+2 for physical assistance;+2 for safety/equipment     General bed mobility comments: assist for RLE and trunk  Transfers Overall transfer level: Needs assistance Equipment used: Rolling walker (2 wheeled) Transfers: Sit to/from Stand Sit to Stand: Mod assist;+2 physical assistance         General transfer comment: assist to power up and stabilize.  Cues for UE/LE placement  Ambulation/Gait Ambulation/Gait assistance: Min assist;+2 safety/equipment Ambulation Distance (Feet): 47 Feet Assistive device: Rolling walker (2 wheeled) Gait Pattern/deviations: Step-to pattern;Decreased step length - right;Decreased step length - left;Shuffle;Trunk flexed Gait velocity: decr   General Gait Details: Step by step cues for sequence, posture, position from [employer]            Wheelchair Mobility    Modified Rankin (Stroke Patients Only)       Balance                                    Cognition Arousal/Alertness: Awake/alert Behavior During Therapy: WFL for tasks assessed/performed Overall Cognitive Status:  Within Functional Limits for tasks assessed                      Exercises Total Joint Exercises Ankle Circles/Pumps: AROM;Both;15 reps;Supine Quad Sets: AROM;Both;10 reps;Supine Heel Slides: AAROM;Right;15 reps;Supine Hip ABduction/ADduction: AAROM;Right;10 reps;Supine    General Comments        Pertinent Vitals/Pain Pain Assessment: Faces Faces Pain Scale: Hurts even more Pain Location: R hip Pain Descriptors / Indicators: Aching;Sore Pain Intervention(s): Limited activity within patient's tolerance;Monitored during session;Premedicated before session;Ice applied    Home Living Family/patient expects to be discharged to:: Private residence Living Arrangements: Spouse/significant other Available Help at Discharge: Family Type of Home: House Home Access: Stairs to enter Entrance Stairs-Rails: None Home Layout: One level Home Equipment: None      Prior Function Level of Independence: Independent          PT Goals (current goals can now be found in the care plan section) Acute Rehab PT Goals Patient Stated Goal: Move with less pain PT Goal Formulation: With patient Time For Goal Achievement: 03/13/15 Potential to Achieve Goals: Good Progress towards PT goals: Progressing toward goals    Frequency  7X/week    PT Plan Current plan remains appropriate    Co-evaluation PT/OT/SLP Co-Evaluation/Treatment: Yes Reason for Co-Treatment: For patient/therapist safety PT goals addressed during session: Mobility/safety with mobility OT goals addressed during session: ADL's and self-care     End of Session Equipment Utilized During Treatment: Gait belt Activity Tolerance: Patient tolerated treatment well Patient left: in bed;with call  bell/phone within reach;with family/visitor present     Time: 1219-1255 PT Time Calculation (min) (ACUTE ONLY): 36 min  Charges:  $Gait Training: 23-37 mins $Therapeutic Exercise: 8-22 mins                    G Codes:       Eva Vallee 2015/03/17, 1:49 PM

## 2015-03-06 NOTE — Evaluation (Signed)
Physical Therapy Evaluation Patient Details Name: Joanna Jones MRN: [id number] DOB: Feb 12, 1946 Today's Date: 03/06/2015   History of Present Illness  pt is s/p R hemiarthroplasty after a fall  Clinical Impression  Pt s/p R hip fx and hemiarthroplasty repair presents with decreased R LE strength, post op pain, R UE pain(from fall), elevated anxiety level and posterior THP limiting functional mobility.  Pt hopes to progress to dc home with family assist and HHPT follow up but is willing to consider short term rehab if needed.    Follow Up Recommendations Home health PT;SNF    Equipment Recommendations  Rolling walker with 5" wheels    Recommendations for Other Services OT consult     Precautions / Restrictions Precautions Precautions: Posterior Hip Precaution Booklet Issued: Yes (comment) Precaution Comments: sign hung on wall Restrictions Weight Bearing Restrictions: No Other Position/Activity Restrictions: WBAT      Mobility  Bed Mobility Overal bed mobility: +2 for physical assistance;Needs Assistance Bed Mobility: Supine to Sit     Supine to sit: Mod assist;+2 for physical assistance     General bed mobility comments: assist for RLE and trunk  Transfers Overall transfer level: Needs assistance Equipment used: Rolling walker (2 wheeled) Transfers: Sit to/from Stand Sit to Stand: Mod assist;+2 physical assistance         General transfer comment: assist to power up and stabilize.  Cues for UE/LE placement  Ambulation/Gait Ambulation/Gait assistance: Min assist;Mod assist;+2 physical assistance;+2 safety/equipment Ambulation Distance (Feet): 26 Feet Assistive device: Rolling walker (2 wheeled) Gait Pattern/deviations: Step-to pattern;Decreased step length - right;Decreased step length - left;Shuffle;Trunk flexed Gait velocity: decr   General Gait Details: Step by step cues for sequence, posture, position from [employer]            Wheelchair  Mobility    Modified Rankin (Stroke Patients Only)       Balance                                             Pertinent Vitals/Pain Pain Assessment: Faces Faces Pain Scale: Hurts even more Pain Location: R hip Pain Descriptors / Indicators: Aching;Sore Pain Intervention(s): Limited activity within patient's tolerance;Monitored during session;Premedicated before session;Ice applied    Home Living Family/patient expects to be discharged to:: Private residence Living Arrangements: Spouse/significant other Available Help at Discharge: Family Type of Home: House Home Access: Stairs to enter Entrance Stairs-Rails: None Secretary/administrator of Steps: 2 Home Layout: One level Home Equipment: None      Prior Function Level of Independence: Independent               Hand Dominance   Dominant Hand: Right    Extremity/Trunk Assessment   Upper Extremity Assessment: Overall WFL for tasks assessed RUE Deficits / Details: painful from fall elbow to shoulder.  Pt is able to lift to 120; difficulty with supination. Able to hold onto walker.  xrays negative for fx         Lower Extremity Assessment: RLE deficits/detail RLE Deficits / Details: hip strength 2/5 with AAROM at hip to 80 flex and 20 abd    Cervical / Trunk Assessment: Normal  Communication   Communication: No difficulties  Cognition Arousal/Alertness: Awake/alert Behavior During Therapy: WFL for tasks assessed/performed Overall Cognitive Status: Within Functional Limits for tasks assessed  General Comments      Exercises Total Joint Exercises Ankle Circles/Pumps: AROM;Both;15 reps;Supine Quad Sets: AROM;Both;10 reps;Supine Heel Slides: AAROM;Right;15 reps;Supine Hip ABduction/ADduction: AAROM;Right;10 reps;Supine      Assessment/Plan    PT Assessment Patient needs continued PT services  PT Diagnosis Difficulty walking   PT Problem List Decreased  strength;Decreased range of motion;Decreased activity tolerance;Decreased mobility;Decreased knowledge of use of DME;Pain;Decreased knowledge of precautions  PT Treatment Interventions DME instruction;Gait training;Stair training;Functional mobility training;Therapeutic activities;Therapeutic exercise;Patient/family education   PT Goals (Current goals can be found in the Care Plan section) Acute Rehab PT Goals Patient Stated Goal: Move with less pain PT Goal Formulation: With patient Time For Goal Achievement: 03/13/15 Potential to Achieve Goals: Good    Frequency 7X/week   Barriers to discharge        Co-evaluation PT/OT/SLP Co-Evaluation/Treatment: Yes Reason for Co-Treatment: For patient/therapist safety PT goals addressed during session: Mobility/safety with mobility OT goals addressed during session: ADL's and self-care       End of Session Equipment Utilized During Treatment: Gait belt Activity Tolerance: Patient tolerated treatment well Patient left: in chair;with call bell/phone within reach;with family/visitor present Nurse Communication: Mobility status         Time: 0918-1000 PT Time Calculation (min) (ACUTE ONLY): 42 min   Charges:   PT Evaluation $Initial PT Evaluation Tier I: 1 Procedure PT Treatments $Therapeutic Exercise: 8-22 mins   PT G Codes:        Jac Romulus 04-04-15, 1:32 PM

## 2015-03-06 NOTE — Care Management Note (Signed)
Case Management Note  Patient Details  Name: Joanna Jones MRN: [id number] Date of Birth: [birth year]  Subjective/Objective:                  Displaced right femoral neck fracture  Action/Plan:  Discharge planning  Expected Discharge Date:  03/08/15               Expected Discharge Plan:  Skilled Nursing Facility  In-House Referral:  Clinical Social Work  Discharge planning Services  CM Consult  Post Acute Care Choice:  NA Choice offered to:  NA  DME Arranged:  N/A DME Agency:  NA  HH Arranged:  NA HH Agency:  NA  Status of Service:  Completed, signed off  Medicare Important Message Given:    Date Medicare IM Given:    Medicare IM give by:    Date Additional Medicare IM Given:    Additional Medicare Important Message give by:     If discussed at Long Length of Stay Meetings, dates discussed:    Additional Comments: CM spoke with patient's spouse. Patient working with PT. Spouse states they would like for the patient to go to rehab for a week following discharge. Concerned about compliance with exercises if HHPT is ordered. CSW made aware of plan to discharge to rehab.  Antony Haste, RN 03/06/2015, 10:42 AM

## 2015-03-06 NOTE — Evaluation (Addendum)
Occupational Therapy Evaluation Patient Details Name: Joanna Jones MRN: [id number] DOB: 1945/09/07 Today's Date: 03/06/2015    History of Present Illness pt is s/p R hemiarthroplasty after a fall   Clinical Impression   This 69 year old female was admitted for the above. She is WBAT and has posterior THPs.  Pt was independent prior to admission, she now needs up to total A for LB adls and mod A x 2 for sit to stand for transfers.  Pt's RUE is sore from frall and impacting ADLs. Will follow in acute setting with mostly min to mod A level goals    Follow Up Recommendations   SNF    Equipment Recommendations  3 in 1 bedside comode    Recommendations for Other Services       Precautions / Restrictions Precautions Precautions: Posterior Hip Restrictions Weight Bearing Restrictions: No      Mobility Bed Mobility Overal bed mobility: +2 for physical assistance;Needs Assistance Bed Mobility: Supine to Sit     Supine to sit: Mod assist;+2 for physical assistance     General bed mobility comments: assist for RLE and trunk  Transfers Overall transfer level: Needs assistance Equipment used: Rolling walker (2 wheeled) Transfers: Sit to/from Stand Sit to Stand: Mod assist;+2 physical assistance         General transfer comment: assist to power up and stabilize.  Cues for UE/LE placement    Balance                                            ADL Overall ADL's : Needs assistance/impaired                                       General ADL Comments: pt is able to perform UB adls with set up; max A for LB bathing and total A for LB dressing.  Pt unable to release a hand in standing.    Educated on AE but did not use this session; will have to further assess to see if pt can use it with sore RUE.  Educated on [employer]     Praxis      Pertinent Vitals/Pain Pain Assessment: Faces Faces Pain Scale: Hurts even  more Pain Location: R hip Pain Descriptors / Indicators: Aching Pain Intervention(s): Limited activity within patient's tolerance;Monitored during session;Premedicated before session;Repositioned;Ice applied     Hand Dominance Right   Extremity/Trunk Assessment Upper Extremity Assessment Upper Extremity Assessment: RUE deficits/detail RUE Deficits / Details: painful from fall elbow to shoulder.  Pt is able to lift to 120; difficulty with supination. Able to hold onto walker.  xrays negative for fx           Communication Communication Communication: No difficulties   Cognition Arousal/Alertness: Awake/alert Behavior During Therapy: WFL for tasks assessed/performed Overall Cognitive Status: Within Functional Limits for tasks assessed                     General Comments       Exercises       Shoulder Instructions      Home Living Family/patient expects to be discharged to:: Private residence Living Arrangements: Spouse/significant other Available Help at Discharge: Family Type of Home:  House Home Access: Stairs to enter [employer] of Steps: 2   Home Layout: One level     Bathroom Shower/Tub: Tub/shower unit;Walk-in shower Shower/tub characteristics: Teacher, early years/pre: None          Prior Functioning/Environment Level of Independence: Independent             OT Diagnosis: Acute pain;Generalized weakness   OT Problem List: Decreased strength;Decreased activity tolerance;Decreased knowledge of use of DME or AE;Decreased knowledge of precautions;Pain   OT Treatment/Interventions: Self-care/ADL training;DME and/or AE instruction;Patient/family education    OT Goals(Current goals can be found in the care plan section) Acute Rehab OT Goals OT Goal Formulation: With patient Time For Goal Achievement: 03/13/15 Potential to Achieve Goals: Good ADL Goals Pt Will Perform Grooming: with  supervision;standing Pt Will Perform Lower Body Bathing: with mod assist;with adaptive equipment;sit to/from stand Pt Will Perform Lower Body Dressing:  (pants only) Pt Will Transfer to Toilet: with min assist;ambulating;bedside commode Pt Will Perform Tub/Shower Transfer: Shower transfer;with min assist;3 in 1;ambulating Additional ADL Goal #1: pt will recall 3/3 thps  OT Frequency: Min 2X/week   Barriers to D/C:            Co-evaluation PT/OT/SLP Co-Evaluation/Treatment: Yes Reason for Co-Treatment: For patient/therapist safety PT goals addressed during session: Mobility/safety with mobility OT goals addressed during session: ADL's and self-care      End of Session    Activity Tolerance: Patient tolerated treatment well Patient left: in chair;with call bell/phone within reach;with family/visitor present   Time: 0922-0955 OT Time Calculation (min): 33 min Charges:  OT General Charges $OT Visit: 1 Procedure OT Evaluation $Initial OT Evaluation Tier I: 1 Procedure G-Codes:    Joanna Jones Mar 29, 2015, 10:06 AM  Joanna Jones, OTR/L [phone] Mar 29, 2015

## 2015-03-06 NOTE — Progress Notes (Signed)
   Subjective: 1 Day Post-Op Procedure(s) (LRB): ARTHROPLASTY BIPOLAR HIP (HEMIARTHROPLASTY) (Right)  Pt c/o moderate pain to right hip Pain with movement  Patient reports pain as moderate.  Objective:   VITALS:   Filed Vitals:   03/06/15 0455  BP: 133/49  Pulse: 64  Temp: 98 F (36.7 C)  Resp: 18    Mild drainage right hip nv intact distally No rashes or edema distally  LABS  Recent Labs  03/05/15 0520 03/06/15 0520  HGB 12.0 10.5*  HCT 36.7 31.9*  WBC 5.3 6.0  PLT 138* 133*     Recent Labs  03/05/15 0520 03/06/15 0520  NA 139 137  K 3.6 3.3*  BUN 8 7  CREATININE 0.52 0.60  GLUCOSE 109* 140*     Assessment/Plan: 1 Day Post-Op Procedure(s) (LRB): ARTHROPLASTY BIPOLAR HIP (HEMIARTHROPLASTY) (Right) PT/OT Discussed with husband potential for SNF placement May weight bear as tolerated May take breaks from knee immobilizer for comfort Pain control Will continue to monitor her progress   Alphonsa Overall, MPAS, PA-C  03/06/2015, 8:31 AM

## 2015-03-06 NOTE — Clinical Social Work Note (Signed)
CSW met with pt and her husband at bedside to discuss SNF options.  Pt refusing SNF so CSW provided information and encouragement also stating that she would check back in the afternoon to see if pt changed her mind about SNF  CSW went back in the afternoon and met with pt, her sister who is an Therapist, sports, her son who is an Rn and other family members to discuss SNF  Pt still refusing SNF and wants to go home.  Pt's sister stated that they wanted [facility] services in pt's home and "they would work it out" for pt to be able to go home and not go to rehab  CSW stated that if pt changed her mind to please have RN call CSW tomorrow morning so a plan can be made for referral to SNF as pt may be discharged on Tuesday  .Dede Query, LCSW [facility] Clinical Social Worker - Weekend Coverage cell #: [phone]

## 2015-03-07 ENCOUNTER — Encounter (HOSPITAL_COMMUNITY): Payer: Self-pay | Admitting: Orthopedic Surgery

## 2015-03-07 LAB — BASIC METABOLIC PANEL
Anion gap: 5 (ref 5–15)
BUN: 9 mg/dL (ref 6–20)
CO2: 25 mmol/L (ref 22–32)
Calcium: 7.9 mg/dL — ABNORMAL LOW (ref 8.9–10.3)
Chloride: 104 mmol/L (ref 101–111)
Creatinine, Ser: 0.46 mg/dL (ref 0.44–1.00)
GFR calc Af Amer: >60 mL/min (ref >60)
GFR calc non Af Amer: >60 mL/min (ref >60)
Glucose, Bld: 130 mg/dL — ABNORMAL HIGH (ref 65–99)
POTASSIUM: 3.9 mmol/L (ref 3.5–5.1)
SODIUM: 134 mmol/L — AB (ref 135–145)

## 2015-03-07 LAB — CBC
HEMATOCRIT: 31.4 % — AB (ref 36.0–46.0)
Hemoglobin: 10.2 g/dL — ABNORMAL LOW (ref 12.0–15.0)
MCH: 29.5 pg (ref 26.0–34.0)
MCHC: 32.5 g/dL (ref 30.0–36.0)
MCV: 90.8 fL (ref 78.0–100.0)
Platelets: 122 10*3/uL — ABNORMAL LOW (ref 150–400)
RBC: 3.46 MIL/uL — AB (ref 3.87–5.11)
RDW: 12.8 % (ref 11.5–15.5)
WBC: 7.9 10*3/uL (ref 4.0–10.5)

## 2015-03-07 MED FILL — Fentanyl Citrate Preservative Free (PF) Inj 100 MCG/2ML: INTRAMUSCULAR | Qty: 2 | Status: AC

## 2015-03-07 NOTE — Care Management Note (Signed)
Case Management Note  Patient Details  Name: Joanna Jones Joanna MINNERDate of Birth: Sep 15, 1945  Subjective/Objective:       Right hip hemiarthroplasty              Action/Plan: Discharge planning, spoke with patient and spouse at bedside. Provided list for choice of HH agency. Spouse stated they would like to use [facility], contacted [facility] rep to follow for d/c needs. Also requesting RW with platform and 3-in-1, notified [facility] of this as well.   Expected Discharge Date:  03/08/15               Expected Discharge Plan:  Home w Home Health Services  In-House Referral:  Clinical Social Work, NA  Discharge planning Services  CM Consult  Post Acute Care Choice:  NA, Home Health Choice offered to:  NA, Spouse  DME Arranged:  3-N-1, Scientific laboratory technician, Walker rolling DME Agency:  NA, [facility]  HH Arranged:  PT HH Agency:  [facility]  Status of Service:  Completed, signed off  Medicare Important Message Given:    Date Medicare IM Given:    Medicare IM give by:    Date Additional Medicare IM Given:    Additional Medicare Important Message give by:     If discussed at Long Length of Stay Meetings, dates discussed:    Additional Comments:  Alexis Goodell, RN 03/07/2015, 12:07 PM

## 2015-03-07 NOTE — Progress Notes (Signed)
Physical Therapy Treatment Patient Details Name: Joanna Jones MRN: [id number] DOB: [birth year] Today's Date: 03/07/2015    History of Present Illness pt is s/p R hemiarthroplasty after a fall    PT Comments    Patient is pushing through the pain. Patient continues to require extensive assist for sit to stand and bed mobility. Spouse present to observe and learn techniques to handle the RLE, The RUE is not useful for mobility, unable to place weight through it when sitting. Is tolerating the Platform RW but needs assist to place arm and remove it from the cradle.  Follow Up Recommendations  Home health PT;Supervision/Assistance - 24 hour     Equipment Recommendations  Rolling walker with 5" wheels (R platform)    Recommendations for Other Services       Precautions / Restrictions Precautions Precautions: Posterior Hip    Mobility  Bed Mobility   Bed Mobility: Sit to Supine       Sit to supine: Mod assist   General bed mobility comments: assist legs onto bed.  Transfers   Equipment used: Rolling walker (2 wheeled) Transfers: Sit to/from Stand Sit to Stand: Mod assist         General transfer comment: assist to power up and stabilize.  Cues for UE/LE placement, cues for RUE placement inside platform , requires assist to actualyy lift R arm into cradle. and remove  arm and place in lap, otherwise, arm would drop.  Ambulation/Gait Ambulation/Gait assistance: Min assist Ambulation Distance (Feet): 40 Feet Assistive device: Rolling walker (2 wheeled) Gait Pattern/deviations: Step-to pattern Gait velocity: decr   General Gait Details: Step by step cues for sequence, posture, position from [employer]   Stairs            Wheelchair Mobility    Modified Rankin (Stroke Patients Only)       Balance                                    Cognition Arousal/Alertness: Awake/alert Behavior During Therapy: Anxious                         Exercises      General Comments        Pertinent Vitals/Pain Pain Score: 8  Pain Location: R hip and R UE Pain Descriptors / Indicators: Aching;Crying    Home Living                      Prior Function            PT Goals (current goals can now be found in the care plan section) Progress towards PT goals: Progressing toward goals    Frequency  7X/week    PT Plan Current plan remains appropriate    Co-evaluation             End of Session   Activity Tolerance: Patient limited by fatigue;Patient limited by pain Patient left: in bed;with call bell/phone within reach     Time: 1520-1539 PT Time Calculation (min) (ACUTE ONLY): 19 min  Charges:  $Gait Training: 8-22 mins                    G Codes:      Rada Hay 03/07/2015, 5:54 PM.sign

## 2015-03-07 NOTE — Care Management Important Message (Signed)
Important Message  Patient Details  Name: Joanna Jones MRN: [id number] Date of Birth: 10-Apr-1946   Medicare Important Message Given:  [facility] notification given    Haskell Flirt 03/07/2015, 1:11 PMImportant Message  Patient Details  Name: Joanna Jones MRN: [id number] Date of Birth: 02-25-1946   Medicare Important Message Given:  Yes-second notification given    Haskell Flirt 03/07/2015, 1:11 PM

## 2015-03-07 NOTE — Progress Notes (Signed)
   Subjective: 2 Days Post-Op Procedure(s) (LRB): ARTHROPLASTY BIPOLAR HIP (HEMIARTHROPLASTY) (Right)  Pt c/o moderate pain to right hip and right upper extremity States therapy and movement are hard Denies any other symptoms Discussed rehab facility vs home Patient reports pain as moderate.  Objective:   VITALS:   Filed Vitals:   03/07/15 0810  BP:   Pulse:   Temp: 99.8 F (37.7 C)  Resp:     Right hip incision healing well Dressing changed per nursing nv intact distally Right upper extremity with mild pain with pronation and supination at the elbow but no deformity or ecchymosis  LABS  Recent Labs  03/05/15 0520 03/06/15 0520 03/07/15 0503  HGB 12.0 10.5* 10.2*  HCT 36.7 31.9* 31.4*  WBC 5.3 6.0 7.9  PLT 138* 133* 122*     Recent Labs  03/05/15 0520 03/06/15 0520 03/07/15 0503  NA 139 137 134*  K 3.6 3.3* 3.9  BUN CREATININE 0.52 0.60 0.46  GLUCOSE 109* 140* 130*     Assessment/Plan: 2 Days Post-Op Procedure(s) (LRB): ARTHROPLASTY BIPOLAR HIP (HEMIARTHROPLASTY) (Right) Continue PT/OT Pain management D/c planning - may need snf    Alphonsa Overall, MPAS, PA-C  03/07/2015, 10:18 AM

## 2015-03-07 NOTE — Progress Notes (Addendum)
Occupational Therapy Treatment Patient Details Name: Joanna Jones MRN: [id number] DOB: October 29, 1945 Today's Date: 03/07/2015    History of present illness pt is s/p R hemiarthroplasty after a fall   OT comments  Pt with painful R UE during activity and ROM attempts. Pt stating pain is significant when she attempts to pronate forearm and also with attempts at shoulder flexion beyond 90 degrees. She uses L UE to support the R UE as she is fearful of her R UE dropping back down suddenly. Used platform walker during functional mobility with PT/OT and pt requiring assist to lift R UE off the platform to bring it back down to her side. Nursing made aware of her R UE limitations and OT request for MD to reevaluate R UE. Pt is currently refusing SNF and would like to d/c home. Recommend HHOT and 24/7 if pt does want to return home.    Follow Up Recommendations  SNF;Home health OT;Supervision/Assistance - 24 hour -pt refuses SNF so recommend HHOT if pt to d/c home and 24/7.    Equipment Recommendations  3 in 1 bedside comode    Recommendations for Other Services      Precautions / Restrictions Precautions Precautions: Posterior Hip Precaution Booklet Issued: Yes (comment) Precaution Comments: Pt able to state 1/3 precautions today Restrictions Other Position/Activity Restrictions: WBAT       Mobility Bed Mobility     Transfers--pt stood with PT already when OT arrived.               Balance                                   ADL                                         General ADL Comments: Pt continues with R UE pain with activity/ROM. PT working with pt when OT arrived and pt using a platform walker. Assisted PT and pt to transfer into hallway with platform walker and chair pulled up once pt ready to sit. See PT notes for ambulation and transfers. Discussed AE options for pt but currently pt with such a painful R UE, she would not be able to  manipulate the AE to adequately use it for ADL. Discussed concerns regarding R UE pain with nursing who states she will discuss with MD. Pt with decreased ability to fully raise R UE overhead and uses L UE to guide R UE into increased flexion as she states she is "afraid it will drop" if she doesnt support it. She is able to supine R UE with good tolerance but pronation today is very painful. Pt rates pain 5/10 in UE with activity. Pt not able to lift R UE off the platform walker herself to sit in the chair and requires assist to support R UE off the platform. Discussed hip precautions with pt and spouse and AE options if R UE becomes less painful that she could consider purchasing if desired. Pt becoming more groggy after ambulation and closing eyes so not able to complete more activity. Informed nursing that pt had concerns about pain meds and level of grogginess.       Vision  Perception     Praxis      Cognition   Behavior During Therapy: Anxious;WFL for tasks assessed/performed Overall Cognitive Status: Within Functional Limits for tasks assessed       Memory: Decreased recall of precautions               Extremity/Trunk Assessment               Exercises     Shoulder Instructions       General Comments      Pertinent Vitals/ Pain       Pain Assessment: 0-10 Pain Score: 10-Worst pain ever Pain Location: R hip and R arm Pain Descriptors / Indicators: Aching;Sharp Pain Intervention(s): Limited activity within patient's tolerance;Monitored during session;Premedicated before session;Repositioned  Home Living                                          Prior Functioning/Environment              Frequency Min 2X/week     Progress Toward Goals  OT Goals(current goals can now be found in the care plan section)  Progress towards OT goals:  (limited by pain)     Plan Discharge plan remains appropriate;Other  (comment) (pt refuses SNF however, so recommend HHOT if home)    Co-evaluation    PT/OT/SLP Co-Evaluation/Treatment: Yes Reason for Co-Treatment: For patient/therapist safety   OT goals addressed during session: ADL's and self-care;Proper use of Adaptive equipment and DME      End of Session Equipment Utilized During Treatment: Other (comment) (platform RW)   Activity Tolerance Patient limited by pain   Patient Left in chair;with call bell/phone within reach;with family/visitor present   Nurse Communication          Time: 6045-4098 OT Time Calculation (min): 23 min  Charges: OT General Charges $OT Visit: 1 Procedure OT Treatments $Therapeutic Activity: 8-22 mins  Lennox Laity  [phone] 03/07/2015, 12:35 PM

## 2015-03-07 NOTE — Progress Notes (Signed)
[facility] PROGRESS NOTE  Joanna Jones RUE:[id number] DOB: [birth year] DOA: 03/05/2015 PCP: Colette Ribas, MD  Assessment/Plan: 1. Displaced right femoral neck fracture. -Patient having mechanical fall at home when she tripped over her dog and fell on her right side. -Orthopedic surgery was consulted and she was taken to the oral on 03/05/2015 undergoing right hip hemiarthroplasty -She tolerated procedure well no immediate complications -She is on Lovenox 40 mg subcutaneous daily for DVT prophylaxis. -Repeat labs showing hemoglobin of 10.2  2.  Hypokalemia -Improved with replacment Potassium 3.9 on am labs  3.  Pain management. -Patient on Norco 5/325 every 6 hours as needed along with Dilaudid 0.5 mg every 2 hours as needed for severe breakthrough pain.  Code Status: Full Code Family Communication:  Disposition Plan: Likely require SNF placement, spoke with SW plan to d/c in the next 24 hours   Consultants:  Ortho  Procedures:  Right hemiarthroplasty   HPI/Subjective: Patient is a pleasant 69 year old female who was admitted to medicine service on 6 03/05/2015 presented after having a fall at home that resulted in a right hip fracture. She was initially taken to [facility] where imaging studies revealed a right femoral neck fracture. Patient was transferred to [facility] where she was evaluated by Dr. Ranell Patrick of orthopedic surgery. Patient was taken to the OR on 03/05/2015 undergoing right hip hemiarthroplasty. Patient tolerated procedure well there no immediate competitions.  Objective: Filed Vitals:   03/07/15 1500  BP: 136/63  Pulse: 85  Temp: 99.7 F (37.6 C)  Resp: 16    Intake/Output Summary (Last 24 hours) at 03/07/15 1842 Last data filed at 03/07/15 1836  Gross per 24 hour  Intake   1420 ml  Output   1400 ml  Net     20 ml   Filed Weights   03/05/15 0300  Weight: 68.947 kg (152 lb)    Exam:   General:  Patient is  awake and alert, reports pain to her right hip  Cardiovascular: Regular rate rhythm normal S1-S2  Respiratory: Normal respiratory effort  Abdomen: Soft nontender nondistended  Musculoskeletal: No extremity edema  Skin: Surgical incision site was inspected, there is dried blood over the area however no evidence of active bleeding.   Data Reviewed: Basic Metabolic Panel:  Recent Labs Lab 03/05/15 0520 03/06/15 0520 03/07/15 0503  NA 139 137 134*  K 3.6 3.3* 3.9  CL 108 108 104  CO2 25 24 25   GLUCOSE 109* 140* 130*  BUN 8 7 9   CREATININE 0.52 0.60 0.46  CALCIUM 8.5* 7.8* 7.9*   Liver Function Tests: No results for input(s): AST, ALT, ALKPHOS, BILITOT, PROT, ALBUMIN in the last 168 hours. No results for input(s): LIPASE, AMYLASE in the last 168 hours. No results for input(s): AMMONIA in the last 168 hours. CBC:  Recent Labs Lab 03/05/15 0520 03/06/15 0520 03/07/15 0503  WBC 5.3 6.0 7.9  HGB 12.0 10.5* 10.2*  HCT 36.7 31.9* 31.4*  MCV 88.6 90.1 90.8  PLT 138* 133* 122*   Cardiac Enzymes: No results for input(s): CKTOTAL, CKMB, CKMBINDEX, TROPONINI in the last 168 hours. BNP (last 3 results) No results for input(s): BNP in the last 8760 hours.  ProBNP (last 3 results) No results for input(s): PROBNP in the last 8760 hours.  CBG: No results for input(s): GLUCAP in the last 168 hours.  Recent Results (from the past 240 hour(s))  Surgical pcr screen     Status: None   Collection Time:  03/05/15  4:13 AM  Result Value Ref Range Status   MRSA, PCR NEGATIVE NEGATIVE Final   Staphylococcus aureus NEGATIVE NEGATIVE Final    Comment:        The Xpert SA Assay (FDA approved for NASAL specimens in patients over 9 years of age), is one component of a comprehensive surveillance program.  Test performance has been validated by [facility] for patients greater than or equal to 71 year old. It is not intended to diagnose infection nor to guide or monitor  treatment.      Studies: No results found.  Scheduled Meds: . docusate sodium  100 mg Oral BID  . enoxaparin (LOVENOX) injection  40 mg Subcutaneous Q24H  . estrogens (conjugated)  0.625 mg Oral Daily  . ferrous sulfate  325 mg Oral TID PC   Continuous Infusions: . sodium chloride 75 mL/hr (03/06/15 1652)    Principal Problem:   Hip fracture Active Problems:   Hip fracture requiring operative repair    Time spent: 25 min    Jeralyn Bennett  [facility] Pager [phone]. If 7PM-7AM, please contact night-coverage at [email or web address], password [facility] 03/07/2015, 6:42 PM  LOS: 2 days

## 2015-03-07 NOTE — Progress Notes (Signed)
Physical Therapy Treatment Patient Details Name: Joanna Jones MRN: [id number] DOB: [birth year] Today's Date: 03/07/2015    History of Present Illness pt is s/p R hemiarthroplasty after a fall    PT Comments    Patient reports plan is to home. Patient has no control of R UE to lift to RW and lower. Patient reports pain when supinating/pronting and wrist flexion/extension. Placed the Platform on the RW which patient verballized helped with RUE pain. Patient has 2 steps to enter , will need to practice but currently coulf=d not tolerate.  Follow Up Recommendations  Home health PT     Equipment Recommendations  Rolling walker with 5" wheels (platform on R)    Recommendations for Other Services       Precautions / Restrictions Precautions Precautions: Posterior Hip Precaution Booklet Issued: Yes (comment) Precaution Comments: Pt able to state 1/3 precautions today Restrictions Other Position/Activity Restrictions: WBAT    Mobility  Bed Mobility   Bed Mobility: Supine to Sit     Supine to sit: Mod assist     General bed mobility comments: assist for RLE and trunk, spouse present to assist with trunk and gain instructions on assisting patient.  Transfers Overall transfer level: Needs assistance Equipment used: Rolling walker (2 wheeled) Transfers: Sit to/from Stand Sit to Stand: Mod assist;From elevated surface         General transfer comment: assist to power up and stabilize.  Cues for UE/LE placement, cues for RUE placement inside platform , requires assist to actualyy lift R arm into cradle. and remove  arm and place in lap, otherwise, arm would drop.  Ambulation/Gait Ambulation/Gait assistance: Min assist Ambulation Distance (Feet): 40 Feet Assistive device: Right platform walker Gait Pattern/deviations: Step-to pattern Gait velocity: decr   General Gait Details: Step by step cues for sequence, posture, position from RW   Stairs             Wheelchair Mobility    Modified Rankin (Stroke Patients Only)       Balance                                    Cognition Arousal/Alertness: Awake/alert Behavior During Therapy: Anxious;WFL for tasks assessed/performed Overall Cognitive Status: Within Functional Limits for tasks assessed       Memory: Decreased recall of precautions              Exercises      General Comments        Pertinent Vitals/Pain Pain Assessment: 0-10 Pain Score: 10-Worst pain ever Pain Location: R hip and R arm Pain Descriptors / Indicators: Aching;Sharp Pain Intervention(s): Limited activity within patient's tolerance;Monitored during session;Premedicated before session;Repositioned    Home Living                      Prior Function            PT Goals (current goals can now be found in the care plan section) Progress towards PT goals: Progressing toward goals    Frequency  7X/week    PT Plan Current plan remains appropriate    Co-evaluation   Reason for Co-Treatment: For patient/therapist safety   OT goals addressed during session: ADL's and self-care;Proper use of Adaptive equipment and DME     End of Session   Activity Tolerance: Patient tolerated treatment well Patient left: in chair  Time: 4098-1191 PT Time Calculation (min) (ACUTE ONLY): 32 min  Charges:  $Gait Training: 23-37 mins                    G Codes:      Rada Hay 03/07/2015, 11:47 AM Blanchard Kelch PT [phone]

## 2015-03-07 NOTE — Progress Notes (Signed)
Utilization review completed.  

## 2015-03-08 DIAGNOSIS — S72001A Fracture of unspecified part of neck of right femur, initial encounter for closed fracture: Secondary | ICD-10-CM | POA: Diagnosis not present

## 2015-03-08 DIAGNOSIS — S7290XC Unspecified fracture of unspecified femur, initial encounter for open fracture type IIIA, IIIB, or IIIC: Secondary | ICD-10-CM | POA: Diagnosis not present

## 2015-03-08 DIAGNOSIS — M25551 Pain in right hip: Secondary | ICD-10-CM | POA: Diagnosis not present

## 2015-03-08 DIAGNOSIS — F411 Generalized anxiety disorder: Secondary | ICD-10-CM | POA: Diagnosis not present

## 2015-03-08 DIAGNOSIS — M818 Other osteoporosis without current pathological fracture: Secondary | ICD-10-CM | POA: Diagnosis not present

## 2015-03-08 DIAGNOSIS — M6281 Muscle weakness (generalized): Secondary | ICD-10-CM | POA: Diagnosis not present

## 2015-03-08 DIAGNOSIS — K5900 Constipation, unspecified: Secondary | ICD-10-CM | POA: Diagnosis not present

## 2015-03-08 DIAGNOSIS — D500 Iron deficiency anemia secondary to blood loss (chronic): Secondary | ICD-10-CM | POA: Diagnosis not present

## 2015-03-08 DIAGNOSIS — M21251 Flexion deformity, right hip: Secondary | ICD-10-CM | POA: Diagnosis not present

## 2015-03-08 DIAGNOSIS — M62838 Other muscle spasm: Secondary | ICD-10-CM | POA: Diagnosis not present

## 2015-03-08 DIAGNOSIS — S7290XD Unspecified fracture of unspecified femur, subsequent encounter for closed fracture with routine healing: Secondary | ICD-10-CM | POA: Diagnosis not present

## 2015-03-08 LAB — URINALYSIS, ROUTINE W REFLEX MICROSCOPIC
BILIRUBIN URINE: NEGATIVE
Glucose, UA: NEGATIVE mg/dL
HGB URINE DIPSTICK: NEGATIVE
KETONES UR: NEGATIVE mg/dL
Leukocytes, UA: NEGATIVE
Nitrite: NEGATIVE
Protein, ur: NEGATIVE mg/dL
Specific Gravity, Urine: 1.01 (ref 1.005–1.030)
Urobilinogen, UA: 0.2 mg/dL (ref 0.0–1.0)
pH: 7.5 (ref 5.0–8.0)

## 2015-03-08 LAB — CBC
HEMATOCRIT: 31.6 % — AB (ref 36.0–46.0)
Hemoglobin: 10.4 g/dL — ABNORMAL LOW (ref 12.0–15.0)
MCH: 29.4 pg (ref 26.0–34.0)
MCHC: 32.9 g/dL (ref 30.0–36.0)
MCV: 89.3 fL (ref 78.0–100.0)
Platelets: 154 10*3/uL (ref 150–400)
RBC: 3.54 MIL/uL — ABNORMAL LOW (ref 3.87–5.11)
RDW: 12.6 % (ref 11.5–15.5)
WBC: 5.9 10*3/uL (ref 4.0–10.5)

## 2015-03-08 MED ORDER — FERROUS SULFATE 325 (65 FE) MG PO TABS: 325.0000 mg | ORAL_TABLET | Freq: Three times a day (TID) | ORAL | Status: DC

## 2015-03-08 MED ORDER — POLYETHYLENE GLYCOL 3350 17 G PO PACK: 17.0000 g | PACK | Freq: Every day | ORAL | Status: DC | PRN

## 2015-03-08 MED ORDER — DOCUSATE SODIUM 100 MG PO CAPS
100.0000 mg | ORAL_CAPSULE | Freq: Two times a day (BID) | ORAL | Status: DC
Start: 2015-03-08 — End: 2015-12-13

## 2015-03-08 NOTE — Progress Notes (Signed)
Pt transferred to [facility] via [facility]AR. IV discontinued and site is clean, dry and intact. Dressing changed to right hip. Family notified and husband at bedside.

## 2015-03-08 NOTE — Progress Notes (Signed)
   Subjective: 3 Days Post-Op Procedure(s) (LRB): ARTHROPLASTY BIPOLAR HIP (HEMIARTHROPLASTY) (Right)  Pt c/o mild pain to right hip and mild pain to right forearm mainly with pronation and supination Denies any pain in the right shoulder today Had difficulty with therapy trying to use a walker Denies any new symptoms or issues Patient reports pain as mild.  Objective:   VITALS:   Filed Vitals:   03/08/15 0515  BP: 128/66  Pulse: 87  Temp: 99.3 F (37.4 C)  Resp: 16    Right hip incision healing well nv intact distally No rashes or edema Mild pain to right forearm with pronation and supination but no deformity  LABS  Recent Labs  03/06/15 0520 03/07/15 0503 03/08/15 0525  HGB 10.5* 10.2* 10.4*  HCT 31.9* 31.4* 31.6*  WBC 6.0 7.9 5.9  PLT 133* 122* 154     Recent Labs  03/06/15 0520 03/07/15 0503  NA 137 134*  K 3.3* 3.9  BUN 7 9  CREATININE 0.60 0.46  GLUCOSE 140* 130*     Assessment/Plan: 3 Days Post-Op Procedure(s) (LRB): ARTHROPLASTY BIPOLAR HIP (HEMIARTHROPLASTY) (Right) Weight bearing as tolerated right upper and lower extremity F/u in 2 weeks Pain control as needed    Alphonsa Overall, MPAS, PA-C  03/08/2015, 10:47 AM

## 2015-03-08 NOTE — Clinical Social Work Placement (Signed)
   CLINICAL SOCIAL WORK PLACEMENT  NOTE  Date:  03/08/2015  Patient Details  Name: Joanna Jones MRN: [id number] Date of Birth: 30-Dec-1945  Clinical Social Work is seeking post-discharge placement for this patient at the Skilled  Nursing Facility level of care (*CSW will initial, date and re-position this form in  chart as items are completed):  Yes   Patient/family provided with [facility] Clinical Social Work Department's list of facilities offering this level of care within the geographic area requested by the patient (or if unable, by the patient's family).  Yes   Patient/family informed of their freedom to choose among providers that offer the needed level of care, that participate in Medicare, Medicaid or managed care program needed by the patient, have an available bed and are willing to accept the patient.  Yes   Patient/family informed of [facility]'s ownership interest in [facility] and [facility], as well as of the fact that they are under no obligation to receive care at these facilities.  PASRR submitted to EDS on 03/07/15     PASRR number received on 03/07/15     Existing PASRR number confirmed on       FL2 transmitted to all facilities in geographic area requested by pt/family on 03/07/15     FL2 transmitted to all facilities within larger geographic area on       Patient informed that his/her managed care company has contracts with or will negotiate with certain facilities, including the following:        Yes   Patient/family informed of bed offers received.  Patient chooses bed at [facility]     Physician recommends and patient chooses bed at      Patient to be transferred to [facility] on 03/08/15.  Patient to be transferred to facility by [facility]     Patient family notified on 03/08/15 of transfer.  Name of family member notified:  SPOUSE     PHYSICIAN       Additional Comment: Pt / spouse are in agreement with [facility] at [facility] with d/c today. [facility] transport is required. Pt / spouse are aware there may be out of pocket costs associated with [facility] transport. NSG reviewed d/c summary, scripts, avs. Scripts included in d/c packet.   _______________________________________________ Royetta Asal, LCSW  [phone] 03/08/2015, 2:56 PM

## 2015-03-08 NOTE — Progress Notes (Signed)
Physical Therapy Treatment Patient Details Name: Joanna Jones MRN: [id number] DOB: [birth year] Today's Date: 03/08/2015    History of Present Illness pt is s/p R hemiarthroplasty after a fall    PT Comments    Patient had just returned to bed. Assisted with exercises only, very drowsy, frequent stimulation to arouse. Has decided to go to SNF. Agree best plan  Follow Up Recommendations  SNF     Equipment Recommendations  None recommended by PT    Recommendations for Other Services       Precautions / Restrictions Precautions Precautions: Posterior Hip    Mobility  Bed Mobility                  Transfers                    Ambulation/Gait                 Stairs            Wheelchair Mobility    Modified Rankin (Stroke Patients Only)       Balance                                    Cognition Arousal/Alertness: Lethargic;Suspect due to medications Behavior During Therapy: Anxious                        Exercises Total Joint Exercises Ankle Circles/Pumps: AROM;Both;15 reps;Supine Short Arc Quad: AROM;Right;10 reps;Supine Heel Slides: AAROM;Right;20 reps;Supine Hip ABduction/ADduction: AAROM;Right;20 reps;Supine    General Comments        Pertinent Vitals/Pain Pain Score: 4  Pain Location: R hip Pain Descriptors / Indicators: Aching Pain Intervention(s): Limited activity within patient's tolerance;Monitored during session;Premedicated before session;Repositioned;Ice applied    Home Living                      Prior Function            PT Goals (current goals can now be found in the care plan section) Progress towards PT goals: Progressing toward goals    Frequency  7X/week    PT Plan Discharge plan needs to be updated    Co-evaluation             End of Session   Activity Tolerance: Patient limited by lethargy Patient left: in bed;with call bell/phone within reach      Time: 1202-1219 PT Time Calculation (min) (ACUTE ONLY): 17 min  Charges:  $Therapeutic Exercise: 8-22 mins                    G Codes:      Rada Hay 03/08/2015, 1:13 PM

## 2015-03-08 NOTE — Discharge Summary (Signed)
Physician Discharge Summary  Joanna Jones:[id number] DOB: February 22, 1946 DOA: 03/05/2015  PCP: Colette Ribas, MD  Admit date: 03/05/2015 Discharge date: 03/08/2015  Time spent: 35 minutes  Recommendations for Outpatient Follow-up:  1. Follow up on CBC and BMP on hospital follow up   Discharge Diagnoses:  Principal Problem:   Hip fracture Active Problems:   Hip fracture requiring operative repair   Discharge Condition: Stable  Diet recommendation: Regular diet  Filed Weights   03/05/15 0300  Weight: 68.947 kg (152 lb)    History of present illness:  Joanna Jones is a 69 y.o. female who tripped over her dog gate and fell onto her right side injuring her right hip and right arm who was taken to the [facility] Ed and found to have an acute Right Femoral Neck Fracture On X-ray. Orthopedic Surgery Dr Roswell Miners was consulted and arrangements were made for transfer to [facility] Course:  Patient is a pleasant 69 year old female who was admitted to medicine service on 6 03/05/2015 presented after having a fall at home that resulted in a right hip fracture. She was initially taken to [facility] where imaging studies revealed a right femoral neck fracture. Patient was transferred to [facility] where she was evaluated by Dr. Ranell Patrick of orthopedic surgery. Patient was taken to the OR on 03/05/2015 undergoing right hip hemiarthroplasty. Patient tolerated procedure well there no immediate competitions.Patient was discharge to SNF for acute rehab.   Procedures:  Right hip hemiarthroplasty  Consultations:  Orthopedic Surgery  Discharge Exam: Filed Vitals:   03/08/15 0515  BP: 128/66  Pulse: 87  Temp: 99.3 F (37.4 C)  Resp: 16     General: Patient is awake and alert, reports pain to her right hip  Cardiovascular: Regular rate rhythm normal S1-S2  Respiratory: Normal respiratory effort  Abdomen: Soft nontender  nondistended  Musculoskeletal: No extremity edema  Skin: Surgical incision site was inspected, there is dried blood over the area however no evidence of active bleeding.  Discharge Instructions   Discharge Instructions    Call MD for:  difficulty breathing, headache or visual disturbances    Complete by:  As directed      Call MD for:  extreme fatigue    Complete by:  As directed      Call MD for:  hives    Complete by:  As directed      Call MD for:  persistant dizziness or light-headedness    Complete by:  As directed      Call MD for:  persistant nausea and vomiting    Complete by:  As directed      Call MD for:  redness, tenderness, or signs of infection (pain, swelling, redness, odor or green/yellow discharge around incision site)    Complete by:  As directed      Call MD for:  severe uncontrolled pain    Complete by:  As directed      Call MD for:  temperature >100.4    Complete by:  As directed      Call MD for:    Complete by:  As directed      Diet - low sodium heart healthy    Complete by:  As directed      Increase activity slowly    Complete by:  As directed      Weight bearing as tolerated    Complete by:  As directed   Laterality:  right  Extremity:  Lower          Current Discharge Medication List    START taking these medications   Details  docusate sodium (COLACE) 100 MG capsule Take 1 capsule (100 mg total) by mouth 2 (two) times daily. Qty: 10 capsule, Refills: 0    enoxaparin (LOVENOX) 40 MG/0.4ML injection Inject 0.4 mLs (40 mg total) into the skin daily. 30 days post op Qty: 30 mL, Refills: 0    ferrous sulfate 325 (65 FE) MG tablet Take 1 tablet (325 mg total) by mouth 3 (three) times daily after meals. Qty: 30 tablet, Refills: 3    HYDROcodone-acetaminophen (NORCO) 5-325 MG per tablet Take 1-2 tablets by mouth every 6 (six) hours as needed for moderate pain. Qty: 60 tablet, Refills: 0    methocarbamol (ROBAXIN) 500 MG tablet Take 1 tablet  (500 mg total) by mouth 3 (three) times daily as needed. Qty: 60 tablet, Refills: 1    polyethylene glycol (MIRALAX / GLYCOLAX) packet Take 17 g by mouth daily as needed for mild constipation. Qty: 14 each, Refills: 0      CONTINUE these medications which have NOT CHANGED   Details  Calcium-Magnesium-Vitamin D (CALCIUM 1200+D3 PO) Take 1 tablet by mouth daily.    estrogens, conjugated, (PREMARIN) 1.25 MG tablet Take 0.625 mg by mouth daily.    LORazepam (ATIVAN) 0.5 MG tablet Take 0.5 mg by mouth every 8 (eight) hours as needed for anxiety.    polyvinyl alcohol (ARTIFICIAL TEARS) 1.4 % ophthalmic solution Place 1 drop into both eyes 4 (four) times daily as needed for dry eyes.       Allergies  Allergen Reactions  . Compazine Other (See Comments)    Shot nervous systems  . Sulfa Antibiotics Other (See Comments)    Blisters inside mouth   Follow-up Information    Follow up with NORRIS,STEVEN R, MD. Call in 2 weeks.   Specialty:  Orthopedic Surgery   Why:  [phone]   Contact information:   [address] [place] [address] [phone]       Follow up with [facility].   Why:  physical therapy and equipment   Contact information:   [address] [place] [address] [phone]       Follow up with Colette Ribas, MD In 2 weeks.   Specialty:  Family Medicine   Contact information:   [address] [place] [address] [phone]        The results of significant diagnostics from this hospitalization (including imaging, microbiology, ancillary and laboratory) are listed below for reference.    Significant Diagnostic Studies: Dg Forearm Right  03/05/2015   CLINICAL DATA:  Intra op right forearm to r/o fx  EXAM: RIGHT FOREARM - 2 VIEW  COMPARISON:  03/05/2015 at 8:36 a.m.  FINDINGS: No fracture. Elbow and wrist joints are normally aligned. Bones are demineralized. Soft tissues are unremarkable.  IMPRESSION:  No fracture or dislocation.   Electronically Signed   By: Amie Portland M.D.   On: 03/05/2015 12:27   Dg Forearm Right  03/05/2015   CLINICAL DATA:  69 year old female with history of trauma from a fall complaining of right forearm pain.  EXAM: RIGHT FOREARM - 2 VIEW  COMPARISON:  No priors.  FINDINGS: There is no evidence of fracture or other focal bone lesions. Soft tissues are unremarkable.  IMPRESSION: Negative.   Electronically Signed   By: Brayton Mars.D.  On: 03/05/2015 09:36   Pelvis Portable  03/05/2015   CLINICAL DATA:  69 year old female status post right hip arthroplasty earlier today.  EXAM: PORTABLE PELVIS 1-2 VIEWS  COMPARISON:  03/05/2015.  FINDINGS: Interval right hip bipolar hemiarthroplasty. No periprosthetic fracture adjacent to the femoral stem of the right prosthesis. The prosthetic femoral head appears located in the acetabulum on these AP views. Bony pelvis and visualized portions of the left proximal femur are intact. There is a small amount of gas in the soft tissues lateral to the right hip joint.  IMPRESSION: 1. Status post right hip bipolar hemiarthroplasty, without immediate complicating features, as above.   Electronically Signed   By: Trudie Reed M.D.   On: 03/05/2015 12:59   Dg Pelvis Portable  03/05/2015   CLINICAL DATA:  69 year old female with history of trauma from a fall complaining of right-sided hip pain.  EXAM: PORTABLE PELVIS 1-2 VIEWS  COMPARISON:  No priors.  FINDINGS: Minimally displaced right subcapital femoral neck fracture. Bony pelvis appears intact, as does the visualized portions of the left proximal femur. Right femoral head remains located.  IMPRESSION: 1. Minimally displaced subcapital fracture of the right femoral neck.   Electronically Signed   By: Trudie Reed M.D.   On: 03/05/2015 09:51   Dg Bone Density  02/28/2015   EXAM: DUAL X-RAY ABSORPTIOMETRY (DXA) FOR BONE MINERAL DENSITY  IMPRESSION: Ordering Physician:  Avis Epley  PA-C,  Your patient Joanna Jones completed a BMD test on 02/28/2015 using the [employer] DXA System (software version: 14.10) manufactured by [employer]. The following summarizes the results of our evaluation. PATIENT BIOGRAPHICAL: Name: Joanna Jones, Joanna Jones Patient ID: [id number] Birth Date: 05/28/1946 Height: 65.0 in. Gender: Female Exam Date: 02/28/2015 Weight: 152.0 lbs. Indications: Caucasian, Follow up Osteoporosis, Height Loss, History of Fracture (Adult), Low Calcium Intake, Partial Hysterectomy, Post Menopausal, Unilateral oophrectomy Fractures: Wrist, Patella Treatments: Premarin DENSITOMETRY RESULTS: Site      Region     Measured Date Measured Age WHO Classification Young Adult T-score BMD         %Change vs. Previous Significant Change (*) AP Spine L1-L4 02/28/2015 69.3 Osteoporosis -2.5 0.882 g/cm2 1.5% - AP Spine L1-L4 01/14/2012 66.2 Osteoporosis -2.6 0.869 g/cm2 - -  DualFemur Neck Right 02/28/2015 69.3 Osteoporosis -2.7 0.669 g/cm2 2.1% - DualFemur Neck Right 01/14/2012 66.2 Osteoporosis -2.8 0.655 g/cm2 - - ASSESSMENT: BMD as determined from Femur Neck Right is 0.669 g/cm2 with a T-Score of -2.7. This patient is considered osteoporotic according to World Health Organization [facility]) criteria. Compared with the prior study on 01/14/2012, the BMD of the lumbar spine and right femoral neck shows no statistically significant change. (Patient does not meet criteria for FRAX assessment.)  World Health Organization [facility]) criteria for post-menopausal, Caucasian Women: Normal:       T-score at or above -1 SD Osteopenia:   T-score between -1 and -2.5 SD Osteoporosis: T-score at or below -2.5 SD  RECOMMENDATIONS: National Osteoporosis Foundation recommends that FDA-approved medial therapies be considered in postmenopausal women and men age 57 or older with a: 1. Hip or vertberal (clinical or morphometric) fracture. 2. T-Score of < -2.5 at the spine or hip. 3. Ten-year fracture probability by FRAX of  3% or greater for hip fracture or 20% or greater for major osteoporotic fracture.  All treatment decisions require clinical judgment and consideration of individual patient factors, including patient preferences, co-morbidities, previous drug use, risk factors not captured in the FRAX model (e.g. falls,  vitamin D deficiency, increased bone turnover, interval significant decline in bone density) and possible under-or over-estimation of fracture risk by FRAX.  All patients should ensure an adequate intake of dietary calcium (1200 mg/d) and vitamin D (800 IU daily) unless contraindicated.  FOLLOW-UP: People with diagnosed cases of osteoporosis or osteopenia should be regularly tested for bone mineral density. For patients eligible for Medicare, routine testing is allowed once every 2 years. Testing frequency can be increased for patients who have rapidly progressing disease, or for those who are receiving medical therapy to restore bone mass.  I have reviewed this report, and agree with the above findings.  [facility]   Electronically Signed   By: David  [place] M.D.   On: 02/28/2015 10:39   Dg Humerus Right  03/05/2015   CLINICAL DATA:  Intra op portable right humerus to r/o fx;  EXAM: RIGHT HUMERUS - 2+ VIEW  COMPARISON:  03/04/2015.  FINDINGS: No fracture or dislocation.  No bone lesion.  IMPRESSION: Negative.   Electronically Signed   By: Amie Portland M.D.   On: 03/05/2015 12:24   Mm Screening Breast Tomo Bilateral  03/01/2015   CLINICAL DATA:  Screening.  EXAM: DIGITAL SCREENING BILATERAL MAMMOGRAM WITH 3D TOMO WITH CAD  COMPARISON:  Previous exam(s).  ACR Breast Density Category c: The breast tissue is heterogeneously dense, which may obscure small masses.  FINDINGS: There are no findings suspicious for malignancy. Images were processed with CAD.  IMPRESSION: No mammographic evidence of malignancy. A result letter of this screening mammogram will be mailed directly to the patient.   RECOMMENDATION: Screening mammogram in one year. (Code:[id number]-B-01Y)  BI-RADS CATEGORY  1: Negative.   Electronically Signed   By: Edwin Cap M.D.   On: 03/01/2015 08:24    Microbiology: Recent Results (from the past 240 hour(s))  Surgical pcr screen     Status: None   Collection Time: 03/05/15  4:13 AM  Result Value Ref Range Status   MRSA, PCR NEGATIVE NEGATIVE Final   Staphylococcus aureus NEGATIVE NEGATIVE Final    Comment:        The Xpert SA Assay (FDA approved for NASAL specimens in patients over 6 years of age), is one component of a comprehensive surveillance program.  Test performance has been validated by [facility] for patients greater than or equal to 75 year old. It is not intended to diagnose infection nor to guide or monitor treatment.      Labs: Basic Metabolic Panel:  Recent Labs Lab 03/05/15 0520 03/06/15 0520 03/07/15 0503  NA 139 137 134*  K 3.6 3.3* 3.9  CL 108 108 104  CO2 25 24 25   GLUCOSE 109* 140* 130*  BUN 8 7 9   CREATININE 0.52 0.60 0.46  CALCIUM 8.5* 7.8* 7.9*   Liver Function Tests: No results for input(s): AST, ALT, ALKPHOS, BILITOT, PROT, ALBUMIN in the last 168 hours. No results for input(s): LIPASE, AMYLASE in the last 168 hours. No results for input(s): AMMONIA in the last 168 hours. CBC:  Recent Labs Lab 03/05/15 0520 03/06/15 0520 03/07/15 0503 03/08/15 0525  WBC 5.3 6.0 7.9 5.9  HGB 12.0 10.5* 10.2* 10.4*  HCT 36.7 31.9* 31.4* 31.6*  MCV 88.6 90.1 90.8 89.3  PLT 138* 133* 122* 154   Cardiac Enzymes: No results for input(s): CKTOTAL, CKMB, CKMBINDEX, TROPONINI in the last 168 hours. BNP: BNP (last 3 results) No results for input(s): BNP in the last 8760 hours.  ProBNP (last 3 results) No  results for input(s): PROBNP in the last 8760 hours.  CBG: No results for input(s): GLUCAP in the last 168 hours.     SignedJeralyn Bennett  [facility] 03/08/2015, 11:46 AM

## 2015-03-09 DIAGNOSIS — D500 Iron deficiency anemia secondary to blood loss (chronic): Secondary | ICD-10-CM | POA: Diagnosis not present

## 2015-03-09 DIAGNOSIS — M818 Other osteoporosis without current pathological fracture: Secondary | ICD-10-CM | POA: Diagnosis not present

## 2015-03-09 DIAGNOSIS — S7290XC Unspecified fracture of unspecified femur, initial encounter for open fracture type IIIA, IIIB, or IIIC: Secondary | ICD-10-CM | POA: Diagnosis not present

## 2015-03-24 DIAGNOSIS — F411 Generalized anxiety disorder: Secondary | ICD-10-CM | POA: Diagnosis not present

## 2015-03-24 DIAGNOSIS — S728X1E Other fracture of right femur, subsequent encounter for open fracture type I or II with routine healing: Secondary | ICD-10-CM | POA: Diagnosis not present

## 2015-03-24 DIAGNOSIS — S7291XE Unspecified fracture of right femur, subsequent encounter for open fracture type I or II with routine healing: Secondary | ICD-10-CM | POA: Diagnosis not present

## 2015-03-25 DIAGNOSIS — S7291XE Unspecified fracture of right femur, subsequent encounter for open fracture type I or II with routine healing: Secondary | ICD-10-CM | POA: Diagnosis not present

## 2015-03-25 DIAGNOSIS — F411 Generalized anxiety disorder: Secondary | ICD-10-CM | POA: Diagnosis not present

## 2015-03-29 DIAGNOSIS — F411 Generalized anxiety disorder: Secondary | ICD-10-CM | POA: Diagnosis not present

## 2015-03-29 DIAGNOSIS — S7291XE Unspecified fracture of right femur, subsequent encounter for open fracture type I or II with routine healing: Secondary | ICD-10-CM | POA: Diagnosis not present

## 2015-03-30 DIAGNOSIS — S7291XE Unspecified fracture of right femur, subsequent encounter for open fracture type I or II with routine healing: Secondary | ICD-10-CM | POA: Diagnosis not present

## 2015-03-30 DIAGNOSIS — F411 Generalized anxiety disorder: Secondary | ICD-10-CM | POA: Diagnosis not present

## 2015-03-31 DIAGNOSIS — F411 Generalized anxiety disorder: Secondary | ICD-10-CM | POA: Diagnosis not present

## 2015-03-31 DIAGNOSIS — S7291XE Unspecified fracture of right femur, subsequent encounter for open fracture type I or II with routine healing: Secondary | ICD-10-CM | POA: Diagnosis not present

## 2015-04-01 DIAGNOSIS — F411 Generalized anxiety disorder: Secondary | ICD-10-CM | POA: Diagnosis not present

## 2015-04-01 DIAGNOSIS — S7291XE Unspecified fracture of right femur, subsequent encounter for open fracture type I or II with routine healing: Secondary | ICD-10-CM | POA: Diagnosis not present

## 2015-04-05 DIAGNOSIS — S7291XE Unspecified fracture of right femur, subsequent encounter for open fracture type I or II with routine healing: Secondary | ICD-10-CM | POA: Diagnosis not present

## 2015-04-05 DIAGNOSIS — F411 Generalized anxiety disorder: Secondary | ICD-10-CM | POA: Diagnosis not present

## 2015-04-08 DIAGNOSIS — F411 Generalized anxiety disorder: Secondary | ICD-10-CM | POA: Diagnosis not present

## 2015-04-08 DIAGNOSIS — S7291XE Unspecified fracture of right femur, subsequent encounter for open fracture type I or II with routine healing: Secondary | ICD-10-CM | POA: Diagnosis not present

## 2015-04-21 DIAGNOSIS — Z96641 Presence of right artificial hip joint: Secondary | ICD-10-CM | POA: Diagnosis not present

## 2015-04-21 DIAGNOSIS — Z471 Aftercare following joint replacement surgery: Secondary | ICD-10-CM | POA: Diagnosis not present

## 2015-06-02 DIAGNOSIS — Z96641 Presence of right artificial hip joint: Secondary | ICD-10-CM | POA: Diagnosis not present

## 2015-06-02 DIAGNOSIS — Z471 Aftercare following joint replacement surgery: Secondary | ICD-10-CM | POA: Diagnosis not present

## 2015-07-12 DIAGNOSIS — M810 Age-related osteoporosis without current pathological fracture: Secondary | ICD-10-CM | POA: Diagnosis not present

## 2015-07-12 DIAGNOSIS — Z1389 Encounter for screening for other disorder: Secondary | ICD-10-CM | POA: Diagnosis not present

## 2015-07-12 DIAGNOSIS — G4700 Insomnia, unspecified: Secondary | ICD-10-CM | POA: Diagnosis not present

## 2015-07-12 DIAGNOSIS — Z6824 Body mass index (BMI) 24.0-24.9, adult: Secondary | ICD-10-CM | POA: Diagnosis not present

## 2015-07-12 DIAGNOSIS — M25552 Pain in left hip: Secondary | ICD-10-CM | POA: Diagnosis not present

## 2015-07-14 DIAGNOSIS — M4807 Spinal stenosis, lumbosacral region: Secondary | ICD-10-CM | POA: Diagnosis not present

## 2015-07-14 DIAGNOSIS — Z471 Aftercare following joint replacement surgery: Secondary | ICD-10-CM | POA: Diagnosis not present

## 2015-07-14 DIAGNOSIS — Z96641 Presence of right artificial hip joint: Secondary | ICD-10-CM | POA: Diagnosis not present

## 2015-07-26 DIAGNOSIS — M4807 Spinal stenosis, lumbosacral region: Secondary | ICD-10-CM | POA: Diagnosis not present

## 2015-08-10 DIAGNOSIS — Z471 Aftercare following joint replacement surgery: Secondary | ICD-10-CM | POA: Diagnosis not present

## 2015-08-10 DIAGNOSIS — S72001D Fracture of unspecified part of neck of right femur, subsequent encounter for closed fracture with routine healing: Secondary | ICD-10-CM | POA: Diagnosis not present

## 2015-08-10 DIAGNOSIS — Z96641 Presence of right artificial hip joint: Secondary | ICD-10-CM | POA: Diagnosis not present

## 2015-09-29 DIAGNOSIS — S72001D Fracture of unspecified part of neck of right femur, subsequent encounter for closed fracture with routine healing: Secondary | ICD-10-CM | POA: Diagnosis not present

## 2015-09-29 DIAGNOSIS — M4807 Spinal stenosis, lumbosacral region: Secondary | ICD-10-CM | POA: Diagnosis not present

## 2015-09-29 DIAGNOSIS — Z96641 Presence of right artificial hip joint: Secondary | ICD-10-CM | POA: Diagnosis not present

## 2015-09-29 DIAGNOSIS — Z471 Aftercare following joint replacement surgery: Secondary | ICD-10-CM | POA: Diagnosis not present

## 2015-10-04 ENCOUNTER — Other Ambulatory Visit (HOSPITAL_COMMUNITY): Payer: Self-pay | Admitting: Orthopedic Surgery

## 2015-10-04 DIAGNOSIS — S72001D Fracture of unspecified part of neck of right femur, subsequent encounter for closed fracture with routine healing: Secondary | ICD-10-CM

## 2015-10-12 ENCOUNTER — Encounter (HOSPITAL_COMMUNITY)
Admission: RE | Admit: 2015-10-12 | Discharge: 2015-10-12 | Disposition: A | Discharge status: Home / Self Care | Payer: PPO | Source: Ambulatory Visit | Attending: Orthopedic Surgery | Admitting: Orthopedic Surgery

## 2015-10-12 DIAGNOSIS — S72001D Fracture of unspecified part of neck of right femur, subsequent encounter for closed fracture with routine healing: Secondary | ICD-10-CM | POA: Diagnosis not present

## 2015-10-12 DIAGNOSIS — X58XXXD Exposure to other specified factors, subsequent encounter: Secondary | ICD-10-CM | POA: Diagnosis not present

## 2015-10-12 DIAGNOSIS — Z96641 Presence of right artificial hip joint: Secondary | ICD-10-CM | POA: Diagnosis not present

## 2015-10-12 DIAGNOSIS — Z471 Aftercare following joint replacement surgery: Secondary | ICD-10-CM | POA: Diagnosis not present

## 2015-10-12 MED ORDER — TECHNETIUM TC 99M MEDRONATE IV KIT
25.0000 | PACK | Freq: Once | INTRAVENOUS | Status: AC | PRN
  Administered 2015-10-12: 26 via INTRAVENOUS

## 2015-11-02 DIAGNOSIS — Z471 Aftercare following joint replacement surgery: Secondary | ICD-10-CM | POA: Diagnosis not present

## 2015-11-02 DIAGNOSIS — Z96641 Presence of right artificial hip joint: Secondary | ICD-10-CM | POA: Diagnosis not present

## 2015-11-16 DIAGNOSIS — E782 Mixed hyperlipidemia: Secondary | ICD-10-CM | POA: Diagnosis not present

## 2015-11-16 DIAGNOSIS — Z6824 Body mass index (BMI) 24.0-24.9, adult: Secondary | ICD-10-CM | POA: Diagnosis not present

## 2015-11-16 DIAGNOSIS — Z01818 Encounter for other preprocedural examination: Secondary | ICD-10-CM | POA: Diagnosis not present

## 2015-11-16 DIAGNOSIS — Z01812 Encounter for preprocedural laboratory examination: Secondary | ICD-10-CM | POA: Diagnosis not present

## 2015-11-16 DIAGNOSIS — Z01810 Encounter for preprocedural cardiovascular examination: Secondary | ICD-10-CM | POA: Diagnosis not present

## 2015-11-16 DIAGNOSIS — R7309 Other abnormal glucose: Secondary | ICD-10-CM | POA: Diagnosis not present

## 2015-11-22 DIAGNOSIS — E782 Mixed hyperlipidemia: Secondary | ICD-10-CM | POA: Diagnosis not present

## 2015-11-22 DIAGNOSIS — Z1389 Encounter for screening for other disorder: Secondary | ICD-10-CM | POA: Diagnosis not present

## 2015-11-22 DIAGNOSIS — Z01812 Encounter for preprocedural laboratory examination: Secondary | ICD-10-CM | POA: Diagnosis not present

## 2015-12-01 ENCOUNTER — Encounter (HOSPITAL_COMMUNITY)
Admission: RE | Admit: 2015-12-01 | Discharge: 2015-12-01 | Disposition: A | Discharge status: Home / Self Care | Payer: PPO | Source: Ambulatory Visit | Attending: Orthopedic Surgery | Admitting: Orthopedic Surgery

## 2015-12-01 ENCOUNTER — Encounter (HOSPITAL_COMMUNITY): Payer: Self-pay

## 2015-12-01 DIAGNOSIS — Z01812 Encounter for preprocedural laboratory examination: Secondary | ICD-10-CM | POA: Insufficient documentation

## 2015-12-01 HISTORY — DX: Unspecified osteoarthritis, unspecified site: M19.90

## 2015-12-01 HISTORY — DX: Anxiety disorder, unspecified: F41.9

## 2015-12-01 LAB — SURGICAL PCR SCREEN
MRSA, PCR: NEGATIVE
STAPHYLOCOCCUS AUREUS: NEGATIVE

## 2015-12-01 LAB — TYPE AND SCREEN
ABO/RH(D): O POS
ANTIBODY SCREEN: NEGATIVE

## 2015-12-01 LAB — ABO/RH: ABO/RH(D): O POS

## 2015-12-01 NOTE — Patient Instructions (Signed)
Royetta AsalGlenda F Parrillo  12/01/2015   Your procedure is scheduled on: 12-12-15  [facility]  [facility] to 3rd floor to  Short Stay Center at 0530 AM.  Call this number if you have problems the morning of surgery [phone]   Remember: ONLY 1 PERSON MAY GO WITH YOU TO SHORT STAY TO GET  READY MORNING OF YOUR SURGERY.  Do not eat food or drink liquids :After Midnight.     Take these medicines the morning of surgery with A SIP OF WATER: Lorazepam. Tramadol.Tylenol-if need. DO NOT TAKE ANY DIABETIC MEDICATIONS DAY OF YOUR SURGERY                               You may not have any metal on your body including hair pins and              piercings  Do not wear jewelry, make-up, lotions, powders or perfumes, deodorant             Do not wear nail polish.  Do not shave  48 hours prior to surgery.              Men may shave face and neck.   Do not bring valuables to the hospital. [facility] IS NOT             RESPONSIBLE   FOR VALUABLES.  Contacts, dentures or bridgework may not be worn into surgery.  Leave suitcase in the car. After surgery it may be brought to your room.     Patients discharged the day of surgery will not be allowed to drive home.  Name and phone number of your driver: Melvenia BeamSimon -spouse to provide phone number  Special Instructions: N/A              Please read over the following fact sheets you were given: _____________________________________________________________________             [facility] - Pre[facility] Before surgery, you can play an important role.  Because skin is not sterile, your skin needs to be as free of germs as possible.  You can reduce the number of germs on your skin by washing with CHG (chlorahexidine gluconate) soap before surgery.  CHG is an antiseptic cleaner which kills germs and bonds with the skin to continue killing germs even after washing. Please DO NOT use if you have an allergy  to CHG or antibacterial soaps.  If your skin becomes reddened/irritated stop using the CHG and inform your nurse when you arrive at Short Stay. Do not shave (including legs and underarms) for at least 48 hours prior to the first CHG shower.  You may shave your face/neck. Please follow these instructions carefully:  1.  Shower with CHG Soap the night before surgery and the  morning of Surgery.  2.  If you choose to wash your hair, wash your hair first as usual with your  normal  shampoo.  3.  After you shampoo, rinse your hair and body thoroughly to remove the  shampoo.                           4.  Use CHG as you would any other liquid soap.  You can apply chg directly  to the skin and wash                       Gently with a scrungie or clean washcloth.  5.  Apply the CHG Soap to your body ONLY FROM THE NECK DOWN.   Do not use on face/ open                           Wound or open sores. Avoid contact with eyes, ears mouth and genitals (private parts).                       Wash face,  Genitals (private parts) with your normal soap.             6.  Wash thoroughly, paying special attention to the area where your surgery  will be performed.  7.  Thoroughly rinse your body with warm water from the neck down.  8.  DO NOT shower/wash with your normal soap after using and rinsing off  the CHG Soap.                9.  Pat yourself dry with a clean towel.            10.  Wear clean pajamas.            11.  Place clean sheets on your bed the night of your first shower and do not  sleep with pets. Day of Surgery : Do not apply any lotions/deodorants the morning of surgery.  Please wear clean clothes to the hospital/surgery center.  FAILURE TO FOLLOW THESE INSTRUCTIONS MAY RESULT IN THE CANCELLATION OF YOUR SURGERY PATIENT SIGNATURE_________________________________  NURSE SIGNATURE__________________________________  ________________________________________________________________________   Adam Phenix  An incentive spirometer is a tool that can help keep your lungs clear and active. This tool measures how well you are filling your lungs with each breath. Taking long deep breaths may help reverse or decrease the chance of developing breathing (pulmonary) problems (especially infection) following:  A long period of time when you are unable to move or be active. BEFORE THE PROCEDURE   If the spirometer includes an indicator to show your best effort, your nurse or respiratory therapist will set it to a desired goal.  If possible, sit up straight or lean slightly forward. Try not to slouch.  Hold the incentive spirometer in an upright position. INSTRUCTIONS FOR USE   Sit on the edge of your bed if possible, or sit up as far as you can in bed or on a chair.  Hold the incentive spirometer in an upright position.  Breathe out normally.  Place the mouthpiece in your mouth and seal your lips tightly around it.  Breathe in slowly and as deeply as possible, raising the piston or the ball toward the top of the column.  Hold your breath for 3-5 seconds or for as long as possible. Allow the piston or ball to fall to the bottom of the column.  Remove the mouthpiece from your mouth and breathe out normally.  Rest for a few seconds and repeat Steps 1 through 7 at least 10 times every 1-2 hours when you are awake. Take your time and take a few normal breaths between deep breaths.  The spirometer may include an indicator to show your best effort. Use the indicator as a goal to work toward during each repetition.  After each  set of 10 deep breaths, practice coughing to be sure your lungs are clear. If you have an incision (the cut made at the time of surgery), support your incision when coughing by placing a pillow or rolled up towels firmly against it. Once you are able to get out of bed, walk around indoors and cough well. You may stop using the incentive spirometer when instructed by  your caregiver.  RISKS AND COMPLICATIONS  Take your time so you do not get dizzy or light-headed.  If you are in pain, you may need to take or ask for pain medication before doing incentive spirometry. It is harder to take a deep breath if you are having pain. AFTER USE  Rest and breathe slowly and easily.  It can be helpful to keep track of a log of your progress. Your caregiver can provide you with a simple table to help with this. If you are using the spirometer at home, follow these instructions: [place] IF:   You are having difficultly using the spirometer.  You have trouble using the spirometer as often as instructed.  Your pain medication is not giving enough relief while using the spirometer.  You develop fever of 100.5 F (38.1 C) or higher. SEEK IMMEDIATE MEDICAL CARE IF:   You cough up bloody sputum that had not been present before.  You develop fever of 102 F (38.9 C) or greater.  You develop worsening pain at or near the incision site. MAKE SURE YOU:   Understand these instructions.  Will watch your condition.  Will get help right away if you are not doing well or get worse. Document Released: 11/26/2006 Document Revised: 10/08/2011 Document Reviewed: 01/27/2007 ExitCare Patient Information 2014 Exit[employer], [place].   ________________________________________________________________________  WHAT IS A BLOOD TRANSFUSION? Blood Transfusion Information  A transfusion is the replacement of blood or some of its parts. Blood is made up of multiple cells which provide different functions.  Red blood cells carry oxygen and are used for blood loss replacement.  White blood cells fight against infection.  Platelets control bleeding.  Plasma helps clot blood.  Other blood products are available for specialized needs, such as hemophilia or other clotting disorders. BEFORE THE TRANSFUSION  Who gives blood for transfusions?   Healthy volunteers who are  fully evaluated to make sure their blood is safe. This is blood bank blood. Transfusion therapy is the safest it has ever been in the practice of medicine. Before blood is taken from a donor, a complete history is taken to make sure that person has no history of diseases nor engages in risky social behavior (examples are intravenous drug use or sexual activity with multiple partners). The donor's travel history is screened to minimize risk of transmitting infections, such as malaria. The donated blood is tested for signs of infectious diseases, such as HIV and hepatitis. The blood is then tested to be sure it is compatible with you in order to minimize the chance of a transfusion reaction. If you or a relative donates blood, this is often done in anticipation of surgery and is not appropriate for emergency situations. It takes many days to process the donated blood. RISKS AND COMPLICATIONS Although transfusion therapy is very safe and saves many lives, the main dangers of transfusion include:   Getting an infectious disease.  Developing a transfusion reaction. This is an allergic reaction to something in the blood you were given. Every precaution is taken to prevent this. The decision to have  a blood transfusion has been considered carefully by your caregiver before blood is given. Blood is not given unless the benefits outweigh the risks. AFTER THE TRANSFUSION  Right after receiving a blood transfusion, you will usually feel much better and more energetic. This is especially true if your red blood cells have gotten low (anemic). The transfusion raises the level of the red blood cells which carry oxygen, and this usually causes an energy increase.  The nurse administering the transfusion will monitor you carefully for complications. HOME CARE INSTRUCTIONS  No special instructions are needed after a transfusion. You may find your energy is better. Speak with your caregiver about any limitations on  activity for underlying diseases you may have. SEEK MEDICAL CARE IF:   Your condition is not improving after your transfusion.  You develop redness or irritation at the intravenous (IV) site. SEEK IMMEDIATE MEDICAL CARE IF:  Any of the following symptoms occur over the next 12 hours:  Shaking chills.  You have a temperature by mouth above 102 F (38.9 C), not controlled by medicine.  Chest, back, or muscle pain.  People around you feel you are not acting correctly or are confused.  Shortness of breath or difficulty breathing.  Dizziness and fainting.  You get a rash or develop hives.  You have a decrease in urine output.  Your urine turns a dark color or changes to pink, red, or brown. Any of the following symptoms occur over the next 10 days:  You have a temperature by mouth above 102 F (38.9 C), not controlled by medicine.  Shortness of breath.  Weakness after normal activity.  The white part of the eye turns yellow (jaundice).  You have a decrease in the amount of urine or are urinating less often.  Your urine turns a dark color or changes to pink, red, or brown. Document Released: 07/13/2000 Document Revised: 10/08/2011 Document Reviewed: 03/01/2008 [facility] Patient Information 2014 [place], [place].  _______________________________________________________________________

## 2015-12-01 NOTE — Pre-Procedure Instructions (Addendum)
EKG 7'16 -with chart. Clearance note-Dr. Phillips OdorGolding 11-16-15, labs CBC/d, CMP, LP 11-22-15 with chart to be used. 11-16-15 Hgb A1C 5.8 with chart

## 2015-12-04 NOTE — H&P (Signed)
TOTAL HIP REVISION ADMISSION H&P  Patient is admitted for right revision total hip arthroplasty.  Subjective:  Chief Complaint: Right hip failed hemiarthroplasty  HPI: Joanna Jones, 70 y.o. female, has a history of pain and functional disability in the right hip due to trauma and patient has failed non-surgical conservative treatments for greater than 12 weeks to include NSAID's and/or analgesics, use of assistive devices and activity modification. The indications for the revision total hip arthroplasty are bearing surface wear leading to  symptomatic synovitis.  Onset of symptoms was gradual starting 9 months ago with rapidlly worsening course since that time.  Prior procedures on the right hip include hemi-arthroplasty.  Patient currently rates pain in the right hip at 8 out of 10 with activity.  There is worsening of pain with activity and weight bearing, trendelenberg gait, pain that interfers with activities of daily living and pain with passive range of motion. Patient has evidence of previous hemiarthroplasty by imaging studies.  This condition presents safety issues increasing the risk of falls.   There is no current active infection.   Risks, benefits and expectations were discussed with the patient.  Risks including but not limited to the risk of anesthesia, blood clots, nerve damage, blood vessel damage, failure of the prosthesis, infection and up to and including death.  Patient understand the risks, benefits and expectations and wishes to proceed with surgery.   PCP: Colette Ribas, MD  D/C Plans:      Home with HHPT/SNF  Post-op Meds:       No Rx given   Tranexamic Acid:      To be given - IV    Decadron:      Is to be given  FYI:     ASA  Norco    Patient Active Problem List   Diagnosis Date Noted  . Hip fracture (HCC) 03/05/2015  . Hip fracture requiring operative repair [facility]) 03/05/2015   Past Medical History  Diagnosis Date  . Anxiety   . Arthritis     Past  Surgical History  Procedure Laterality Date  . Hip arthroplasty Right 03/05/2015    Procedure: ARTHROPLASTY BIPOLAR HIP (HEMIARTHROPLASTY);  Surgeon: Beverely Low, MD;  Location: [facility] ORS;  Service: Orthopedics;  Laterality: Right;  . Tonsillectomy    . Abdominal hysterectomy    . Colonoscopy      No prescriptions prior to admission   Allergies  Allergen Reactions  . Compazine Other (See Comments)    Shot nervous systems  . Sulfa Antibiotics Other (See Comments)    Blisters inside mouth  . Tramadol Hcl Itching    Still takes regardless of reaction to it    Social History  Substance Use Topics  . Smoking status: Never Smoker   . Smokeless tobacco: Not on file  . Alcohol Use: No    Family History  Problem Relation Age of Onset  . Diabetes Mellitus II Mother   . CAD Father   . Hypertension Father       Review of Systems  Constitutional: Negative.   HENT: Positive for tinnitus.   Eyes: Negative.   Respiratory: Negative.   Cardiovascular: Negative.   Gastrointestinal: Negative.   Genitourinary: Positive for frequency.  Musculoskeletal: Positive for joint pain.  Skin: Negative.   Neurological: Negative.   Endo/Heme/Allergies: Negative.   Psychiatric/Behavioral: Negative.     Objective:  Physical Exam  Constitutional: She is oriented to person, place, and time. She appears well-developed.  HENT:  Head:  Normocephalic.  Eyes: Pupils are equal, round, and reactive to light.  Neck: Neck supple. No JVD present. No tracheal deviation present. No thyromegaly present.  Cardiovascular: Normal rate, regular rhythm, normal heart sounds and intact distal pulses.   Respiratory: Effort normal and breath sounds normal. No stridor. No respiratory distress. She has no wheezes.  GI: Soft. There is no tenderness. There is no guarding.  Musculoskeletal:       Right hip: She exhibits decreased range of motion, decreased strength, tenderness and bony tenderness. She exhibits no swelling,  no deformity and no laceration.  Lymphadenopathy:    She has no cervical adenopathy.  Neurological: She is alert and oriented to person, place, and time. A sensory deficit (left sided sciatica) is present.  Skin: Skin is warm and dry.  Psychiatric: She has a normal mood and affect.      Labs:  Estimated body mass index is 24.55 kg/(m^2) as calculated from the following:   Height as of 03/05/15: 5\' 6"  (1.676 m).   Weight as of 03/05/15: 68.947 kg (152 lb).  Imaging Review:  Plain radiographs demonstrate previous hemiarthroplasty of the right hip(s).The bone quality appears to be good for age and reported activity level.   Assessment/Plan:  Right hip with failed previous hemiarthroplasty.  The patient history, physical examination, clinical judgement of the provider and imaging studies are consistent with failure of the right hip(s), previous hip hemiarthroplasty. Revision total hip arthroplasty is deemed medically necessary. The treatment options including medical management, injection therapy, arthroscopy and arthroplasty were discussed at length. The risks and benefits of total hip arthroplasty were presented and reviewed. The risks due to aseptic loosening, infection, stiffness, dislocation/subluxation,  thromboembolic complications and other imponderables were discussed.  The patient acknowledged the explanation, agreed to proceed with the plan and consent was signed. Patient is being admitted for inpatient treatment for surgery, pain control, PT, OT, prophylactic antibiotics, VTE prophylaxis, progressive ambulation and ADL's and discharge planning. The patient is planning to be discharged home with home health services / SNF.    Anastasio AuerbachMatthew S. Deejay Koppelman   PA-C  12/04/2015, 10:31 PM

## 2015-12-11 NOTE — Anesthesia Preprocedure Evaluation (Addendum)
Anesthesia Evaluation  Patient identified by MRN, date of birth, ID band Patient awake    Reviewed: Allergy & Precautions, NPO status , Patient's Chart, lab work & pertinent test results  History of Anesthesia Complications Negative for: history of anesthetic complications  Airway Mallampati: III  TM Distance: >3 FB Neck ROM: Full    Dental no notable dental hx. (+) Dental Advisory Given   Pulmonary neg pulmonary ROS,    Pulmonary exam normal breath sounds clear to auscultation       Cardiovascular negative cardio ROS Normal cardiovascular exam Rhythm:Regular Rate:Normal     Neuro/Psych PSYCHIATRIC DISORDERS Anxiety negative neurological ROS     GI/Hepatic negative GI ROS, Neg liver ROS,   Endo/Other  negative endocrine ROS  Renal/GU negative Renal ROS  negative genitourinary   Musculoskeletal  (+) Arthritis ,   Abdominal   Peds negative pediatric ROS (+)  Hematology negative hematology ROS (+)   Anesthesia Other Findings   Reproductive/Obstetrics negative OB ROS                            Anesthesia Physical Anesthesia Plan  ASA: II  Anesthesia Plan: Spinal   Post-op Pain Management:    Induction:   Airway Management Planned: Nasal Cannula  Additional Equipment:   Intra-op Plan:   Post-operative Plan:   Informed Consent: I have reviewed the patients History and Physical, chart, labs and discussed the procedure including the risks, benefits and alternatives for the proposed anesthesia with the patient or authorized representative who has indicated his/her understanding and acceptance.   Dental advisory given  Plan Discussed with: CRNA  Anesthesia Plan Comments:        Anesthesia Quick Evaluation

## 2015-12-12 ENCOUNTER — Inpatient Hospital Stay (HOSPITAL_COMMUNITY): Payer: PPO

## 2015-12-12 ENCOUNTER — Inpatient Hospital Stay (HOSPITAL_COMMUNITY): Payer: PPO | Admitting: Anesthesiology

## 2015-12-12 ENCOUNTER — Inpatient Hospital Stay (HOSPITAL_COMMUNITY)
Admission: RE | Admit: 2015-12-12 | Discharge: 2015-12-13 | DRG: 468 | Disposition: A | Discharge status: Home Health | Payer: PPO | Source: Ambulatory Visit | Attending: Orthopedic Surgery | Admitting: Orthopedic Surgery

## 2015-12-12 ENCOUNTER — Encounter (HOSPITAL_COMMUNITY)
Admission: RE | Disposition: A | Discharge status: Home Health | Payer: Self-pay | Source: Ambulatory Visit | Attending: Orthopedic Surgery

## 2015-12-12 ENCOUNTER — Encounter (HOSPITAL_COMMUNITY): Payer: Self-pay | Admitting: *Deleted

## 2015-12-12 DIAGNOSIS — Z888 Allergy status to other drugs, medicaments and biological substances status: Secondary | ICD-10-CM | POA: Diagnosis not present

## 2015-12-12 DIAGNOSIS — T84090A Other mechanical complication of internal right hip prosthesis, initial encounter: Secondary | ICD-10-CM | POA: Diagnosis not present

## 2015-12-12 DIAGNOSIS — Z96649 Presence of unspecified artificial hip joint: Secondary | ICD-10-CM

## 2015-12-12 DIAGNOSIS — Z882 Allergy status to sulfonamides status: Secondary | ICD-10-CM

## 2015-12-12 DIAGNOSIS — M1611 Unilateral primary osteoarthritis, right hip: Secondary | ICD-10-CM | POA: Diagnosis not present

## 2015-12-12 DIAGNOSIS — Z96641 Presence of right artificial hip joint: Secondary | ICD-10-CM | POA: Diagnosis not present

## 2015-12-12 DIAGNOSIS — Z471 Aftercare following joint replacement surgery: Secondary | ICD-10-CM | POA: Diagnosis not present

## 2015-12-12 DIAGNOSIS — Y838 Other surgical procedures as the cause of abnormal reaction of the patient, or of later complication, without mention of misadventure at the time of the procedure: Secondary | ICD-10-CM | POA: Diagnosis present

## 2015-12-12 DIAGNOSIS — T8484XA Pain due to internal orthopedic prosthetic devices, implants and grafts, initial encounter: Secondary | ICD-10-CM | POA: Diagnosis not present

## 2015-12-12 DIAGNOSIS — T8489XA Other specified complication of internal orthopedic prosthetic devices, implants and grafts, initial encounter: Secondary | ICD-10-CM | POA: Diagnosis not present

## 2015-12-12 DIAGNOSIS — Z8249 Family history of ischemic heart disease and other diseases of the circulatory system: Secondary | ICD-10-CM

## 2015-12-12 DIAGNOSIS — Z833 Family history of diabetes mellitus: Secondary | ICD-10-CM

## 2015-12-12 HISTORY — PX: TOTAL HIP ARTHROPLASTY: SHX124

## 2015-12-12 SURGERY — ARTHROPLASTY, HIP, TOTAL, ANTERIOR APPROACH
Anesthesia: Spinal | Site: Hip | Laterality: Right

## 2015-12-12 MED ORDER — ONDANSETRON HCL 4 MG/2ML IJ SOLN: 4.0000 mg | Freq: Once | INTRAMUSCULAR | Status: DC | PRN

## 2015-12-12 MED ORDER — ONDANSETRON HCL 4 MG/2ML IJ SOLN
INTRAMUSCULAR | Status: AC
  Filled 2015-12-12: qty 2

## 2015-12-12 MED ORDER — CEFAZOLIN SODIUM-DEXTROSE 2-4 GM/100ML-% IV SOLN
2.0000 g | Freq: Four times a day (QID) | INTRAVENOUS | Status: AC
  Administered 2015-12-12 (×2): 2 g via INTRAVENOUS
  Filled 2015-12-12 (×2): qty 100

## 2015-12-12 MED ORDER — METOCLOPRAMIDE HCL 5 MG/ML IJ SOLN: 5.0000 mg | Freq: Three times a day (TID) | INTRAMUSCULAR | Status: DC | PRN

## 2015-12-12 MED ORDER — LORAZEPAM 0.5 MG PO TABS
0.5000 mg | ORAL_TABLET | Freq: Three times a day (TID) | ORAL | Status: DC | PRN
  Administered 2015-12-13: 0.5 mg via ORAL
  Filled 2015-12-12: qty 1

## 2015-12-12 MED ORDER — FERROUS SULFATE 325 (65 FE) MG PO TABS
325.0000 mg | ORAL_TABLET | Freq: Three times a day (TID) | ORAL | Status: DC
  Administered 2015-12-13: 325 mg via ORAL
  Filled 2015-12-12: qty 1

## 2015-12-12 MED ORDER — DEXAMETHASONE SODIUM PHOSPHATE 10 MG/ML IJ SOLN
10.0000 mg | Freq: Once | INTRAMUSCULAR | Status: AC
  Administered 2015-12-12: 10 mg via INTRAVENOUS

## 2015-12-12 MED ORDER — PHENYLEPHRINE 40 MCG/ML (10ML) SYRINGE FOR IV PUSH (FOR BLOOD PRESSURE SUPPORT)
PREFILLED_SYRINGE | INTRAVENOUS | Status: AC
  Filled 2015-12-12: qty 10

## 2015-12-12 MED ORDER — ONDANSETRON HCL 4 MG/2ML IJ SOLN
INTRAMUSCULAR | Status: DC | PRN
  Administered 2015-12-12 (×2): 2 mg via INTRAVENOUS

## 2015-12-12 MED ORDER — MAGNESIUM CITRATE PO SOLN: 1.0000 | Freq: Once | ORAL | Status: DC | PRN

## 2015-12-12 MED ORDER — BUPIVACAINE IN DEXTROSE 0.75-8.25 % IT SOLN
INTRATHECAL | Status: DC | PRN
  Administered 2015-12-12: 2 mL via INTRATHECAL

## 2015-12-12 MED ORDER — SODIUM CHLORIDE 0.9 % IV SOLN
1000.0000 mg | Freq: Once | INTRAVENOUS | Status: AC
  Administered 2015-12-12: 1000 mg via INTRAVENOUS
  Filled 2015-12-12: qty 10

## 2015-12-12 MED ORDER — MIDAZOLAM HCL 2 MG/2ML IJ SOLN
INTRAMUSCULAR | Status: AC
  Filled 2015-12-12: qty 2

## 2015-12-12 MED ORDER — PROPOFOL 10 MG/ML IV BOLUS
INTRAVENOUS | Status: AC
  Filled 2015-12-12: qty 40

## 2015-12-12 MED ORDER — FENTANYL CITRATE (PF) 100 MCG/2ML IJ SOLN: 25.0000 ug | INTRAMUSCULAR | Status: DC | PRN

## 2015-12-12 MED ORDER — DEXTROSE 5 % IV SOLN
500.0000 mg | Freq: Four times a day (QID) | INTRAVENOUS | Status: DC | PRN
  Administered 2015-12-12: 500 mg via INTRAVENOUS
  Filled 2015-12-12: qty 5
  Filled 2015-12-12: qty 550

## 2015-12-12 MED ORDER — ONDANSETRON HCL 4 MG/2ML IJ SOLN: 4.0000 mg | Freq: Four times a day (QID) | INTRAMUSCULAR | Status: DC | PRN

## 2015-12-12 MED ORDER — SODIUM CHLORIDE 0.9 % IR SOLN
Status: DC | PRN
  Administered 2015-12-12: 1000 mL

## 2015-12-12 MED ORDER — DEXAMETHASONE SODIUM PHOSPHATE 10 MG/ML IJ SOLN
10.0000 mg | Freq: Once | INTRAMUSCULAR | Status: AC
  Administered 2015-12-13: 10 mg via INTRAVENOUS
  Filled 2015-12-12: qty 1

## 2015-12-12 MED ORDER — BISACODYL 10 MG RE SUPP: 10.0000 mg | Freq: Every day | RECTAL | Status: DC | PRN

## 2015-12-12 MED ORDER — POLYETHYLENE GLYCOL 3350 17 G PO PACK
17.0000 g | PACK | Freq: Two times a day (BID) | ORAL | Status: DC
  Administered 2015-12-12 – 2015-12-13 (×2): 17 g via ORAL
  Filled 2015-12-12 (×2): qty 1

## 2015-12-12 MED ORDER — DOCUSATE SODIUM 100 MG PO CAPS
100.0000 mg | ORAL_CAPSULE | Freq: Two times a day (BID) | ORAL | Status: DC
  Administered 2015-12-12 – 2015-12-13 (×3): 100 mg via ORAL
  Filled 2015-12-12 (×3): qty 1

## 2015-12-12 MED ORDER — SODIUM CHLORIDE 0.9 % IV SOLN
100.0000 mL/h | INTRAVENOUS | Status: DC
  Filled 2015-12-12 (×5): qty 1000

## 2015-12-12 MED ORDER — ASPIRIN EC 325 MG PO TBEC
325.0000 mg | DELAYED_RELEASE_TABLET | Freq: Two times a day (BID) | ORAL | Status: DC
  Administered 2015-12-13: 325 mg via ORAL
  Filled 2015-12-12: qty 1

## 2015-12-12 MED ORDER — CEFAZOLIN SODIUM-DEXTROSE 2-4 GM/100ML-% IV SOLN
INTRAVENOUS | Status: AC
Start: 2015-12-12 — End: 2015-12-12
  Filled 2015-12-12: qty 100

## 2015-12-12 MED ORDER — MENTHOL 3 MG MT LOZG: 1.0000 | LOZENGE | OROMUCOSAL | Status: DC | PRN

## 2015-12-12 MED ORDER — METOCLOPRAMIDE HCL 5 MG PO TABS: 5.0000 mg | ORAL_TABLET | Freq: Three times a day (TID) | ORAL | Status: DC | PRN

## 2015-12-12 MED ORDER — PROPOFOL 10 MG/ML IV BOLUS
INTRAVENOUS | Status: AC
  Filled 2015-12-12: qty 20

## 2015-12-12 MED ORDER — HYDROCODONE-ACETAMINOPHEN 7.5-325 MG PO TABS
1.0000 | ORAL_TABLET | ORAL | Status: DC
  Administered 2015-12-12: 2 via ORAL
  Administered 2015-12-12: 1 via ORAL
  Administered 2015-12-12: 2 via ORAL
  Administered 2015-12-12: 1 via ORAL
  Administered 2015-12-13 (×4): 2 via ORAL
  Filled 2015-12-12 (×2): qty 2
  Filled 2015-12-12: qty 1
  Filled 2015-12-12: qty 2
  Filled 2015-12-12: qty 1
  Filled 2015-12-12 (×3): qty 2

## 2015-12-12 MED ORDER — STERILE WATER FOR IRRIGATION IR SOLN
Status: DC | PRN
  Administered 2015-12-12: 2000 mL

## 2015-12-12 MED ORDER — CHLORHEXIDINE GLUCONATE 4 % EX LIQD: 60.0000 mL | Freq: Once | CUTANEOUS | Status: DC

## 2015-12-12 MED ORDER — MIDAZOLAM HCL 5 MG/5ML IJ SOLN
INTRAMUSCULAR | Status: DC | PRN
  Administered 2015-12-12: 2 mg via INTRAVENOUS

## 2015-12-12 MED ORDER — METHOCARBAMOL 500 MG PO TABS
500.0000 mg | ORAL_TABLET | Freq: Four times a day (QID) | ORAL | Status: DC | PRN
  Administered 2015-12-13 (×2): 500 mg via ORAL
  Filled 2015-12-12 (×2): qty 1

## 2015-12-12 MED ORDER — PHENYLEPHRINE HCL 10 MG/ML IJ SOLN
INTRAMUSCULAR | Status: AC
  Filled 2015-12-12: qty 1

## 2015-12-12 MED ORDER — SODIUM CHLORIDE 0.9 % IV SOLN
INTRAVENOUS | Status: DC
  Administered 2015-12-12: 1000 mL via INTRAVENOUS
  Administered 2015-12-13: 100 mL/h via INTRAVENOUS

## 2015-12-12 MED ORDER — PROPOFOL 10 MG/ML IV BOLUS
INTRAVENOUS | Status: DC | PRN
  Administered 2015-12-12 (×2): 20 mg via INTRAVENOUS

## 2015-12-12 MED ORDER — PHENYLEPHRINE HCL 10 MG/ML IJ SOLN
INTRAMUSCULAR | Status: DC | PRN
  Administered 2015-12-12 (×7): 80 ug via INTRAVENOUS

## 2015-12-12 MED ORDER — LIDOCAINE HCL (CARDIAC) 20 MG/ML IV SOLN
INTRAVENOUS | Status: AC
Start: 2015-12-12 — End: 2015-12-12
  Filled 2015-12-12: qty 5

## 2015-12-12 MED ORDER — EPHEDRINE SULFATE 50 MG/ML IJ SOLN
INTRAMUSCULAR | Status: DC | PRN
  Administered 2015-12-12: 5 mg via INTRAVENOUS
  Administered 2015-12-12: 10 mg via INTRAVENOUS

## 2015-12-12 MED ORDER — DEXAMETHASONE SODIUM PHOSPHATE 10 MG/ML IJ SOLN
INTRAMUSCULAR | Status: AC
  Filled 2015-12-12: qty 1

## 2015-12-12 MED ORDER — HYDROMORPHONE HCL 1 MG/ML IJ SOLN
0.5000 mg | INTRAMUSCULAR | Status: DC | PRN
  Administered 2015-12-12: 0.5 mg via INTRAVENOUS
  Filled 2015-12-12: qty 1

## 2015-12-12 MED ORDER — POLYVINYL ALCOHOL 1.4 % OP SOLN
1.0000 [drp] | Freq: Four times a day (QID) | OPHTHALMIC | Status: DC | PRN
  Filled 2015-12-12: qty 15

## 2015-12-12 MED ORDER — TRANEXAMIC ACID 1000 MG/10ML IV SOLN
1000.0000 mg | Freq: Once | INTRAVENOUS | Status: AC
  Administered 2015-12-12: 1000 mg via INTRAVENOUS
  Filled 2015-12-12: qty 10

## 2015-12-12 MED ORDER — DIPHENHYDRAMINE HCL 25 MG PO CAPS: 25.0000 mg | ORAL_CAPSULE | Freq: Four times a day (QID) | ORAL | Status: DC | PRN

## 2015-12-12 MED ORDER — FENTANYL CITRATE (PF) 100 MCG/2ML IJ SOLN
INTRAMUSCULAR | Status: AC
  Filled 2015-12-12: qty 2

## 2015-12-12 MED ORDER — LIDOCAINE HCL (CARDIAC) 20 MG/ML IV SOLN
INTRAVENOUS | Status: DC | PRN
  Administered 2015-12-12: 40 mg via INTRAVENOUS

## 2015-12-12 MED ORDER — LACTATED RINGERS IV SOLN
INTRAVENOUS | Status: DC | PRN
  Administered 2015-12-12 (×3): via INTRAVENOUS

## 2015-12-12 MED ORDER — ONDANSETRON HCL 4 MG PO TABS: 4.0000 mg | ORAL_TABLET | Freq: Four times a day (QID) | ORAL | Status: DC | PRN

## 2015-12-12 MED ORDER — CEFAZOLIN SODIUM-DEXTROSE 2-4 GM/100ML-% IV SOLN
2.0000 g | INTRAVENOUS | Status: AC
  Administered 2015-12-12: 2 g via INTRAVENOUS
  Filled 2015-12-12: qty 100

## 2015-12-12 MED ORDER — FENTANYL CITRATE (PF) 100 MCG/2ML IJ SOLN
INTRAMUSCULAR | Status: DC | PRN
  Administered 2015-12-12 (×2): 50 ug via INTRAVENOUS

## 2015-12-12 MED ORDER — PHENOL 1.4 % MT LIQD: 1.0000 | OROMUCOSAL | Status: DC | PRN

## 2015-12-12 MED ORDER — PROPOFOL 500 MG/50ML IV EMUL
INTRAVENOUS | Status: DC | PRN
  Administered 2015-12-12: 75 ug/kg/min via INTRAVENOUS

## 2015-12-12 MED ORDER — ESTROGENS CONJUGATED 0.625 MG PO TABS
0.6250 mg | ORAL_TABLET | Freq: Every day | ORAL | Status: DC
  Administered 2015-12-12 – 2015-12-13 (×2): 0.625 mg via ORAL
  Filled 2015-12-12 (×2): qty 1

## 2015-12-12 SURGICAL SUPPLY — 38 items
BAG ZIPLOCK 12X15 (MISCELLANEOUS) ×3 IMPLANT
CLOTH BEACON ORANGE TIMEOUT ST (SAFETY) ×3 IMPLANT
COVER PERINEAL POST (MISCELLANEOUS) ×3 IMPLANT
CUP ACETBLR 52 OD PINNACLE (Hips) ×3 IMPLANT
DRAPE STERI IOBAN 125X83 (DRAPES) ×3 IMPLANT
DRAPE U-SHAPE 47X51 STRL (DRAPES) ×6 IMPLANT
DRESSING AQUACEL AG SP 3.5X10 (GAUZE/BANDAGES/DRESSINGS) ×1 IMPLANT
DRSG AQUACEL AG SP 3.5X10 (GAUZE/BANDAGES/DRESSINGS) ×3
DURAPREP 26ML APPLICATOR (WOUND CARE) ×3 IMPLANT
ELECT REM PT RETURN 9FT ADLT (ELECTROSURGICAL) ×3
ELECTRODE REM PT RTRN 9FT ADLT (ELECTROSURGICAL) ×1 IMPLANT
ELIMINATOR HOLE APEX DEPUY (Hips) ×3 IMPLANT
GLOVE BIOGEL PI IND STRL 7.0 (GLOVE) ×1 IMPLANT
GLOVE BIOGEL PI IND STRL 7.5 (GLOVE) ×4 IMPLANT
GLOVE BIOGEL PI IND STRL 8.5 (GLOVE) ×1 IMPLANT
GLOVE BIOGEL PI INDICATOR 7.0 (GLOVE) ×2
GLOVE BIOGEL PI INDICATOR 7.5 (GLOVE) ×8
GLOVE BIOGEL PI INDICATOR 8.5 (GLOVE) ×2
GLOVE ECLIPSE 8.0 STRL XLNG CF (GLOVE) ×6 IMPLANT
GLOVE ORTHO TXT STRL SZ7.5 (GLOVE) ×3 IMPLANT
GLOVE SURG SS PI 7.0 STRL IVOR (GLOVE) ×3 IMPLANT
GLOVE SURG SS PI 7.5 STRL IVOR (GLOVE) ×3 IMPLANT
GOWN STRL REUS W/TWL LRG LVL3 (GOWN DISPOSABLE) ×3 IMPLANT
GOWN STRL REUS W/TWL XL LVL3 (GOWN DISPOSABLE) ×9 IMPLANT
HEAD FEM DLT TS CER 36X+5.0 (Head) ×3 IMPLANT
HOLDER FOLEY CATH W/STRAP (MISCELLANEOUS) ×3 IMPLANT
LINER NEUTRAL 52X36MM PLUS 4 (Liner) ×3 IMPLANT
LIQUID BAND (GAUZE/BANDAGES/DRESSINGS) ×3 IMPLANT
PACK ANTERIOR HIP CUSTOM (KITS) ×3 IMPLANT
SAW OSC TIP CART 19.5X105X1.3 (SAW) ×3 IMPLANT
SCREW 6.5MMX30MM (Screw) ×3 IMPLANT
SUT MNCRL AB 4-0 PS2 18 (SUTURE) ×3 IMPLANT
SUT VIC AB 1 CT1 36 (SUTURE) ×9 IMPLANT
SUT VIC AB 2-0 CT1 27 (SUTURE) ×4
SUT VIC AB 2-0 CT1 TAPERPNT 27 (SUTURE) ×2 IMPLANT
SUT VLOC 180 0 24IN GS25 (SUTURE) ×3 IMPLANT
TRAY FOLEY W/METER SILVER 14FR (SET/KITS/TRAYS/PACK) ×3 IMPLANT
YANKAUER SUCT BULB TIP 10FT TU (MISCELLANEOUS) IMPLANT

## 2015-12-12 NOTE — Evaluation (Signed)
Physical Therapy Evaluation Patient Details Name: Joanna AsalGlenda F Jones MRN: [id number] DOB: 05/27/1946 Today's Date: 12/12/2015   History of Present Illness  conversion R hip hemiarthroplasty to posterior THA 12/12/15  Clinical Impression  The patient tolerated ambulation very well. Frequent encouragement. Patient  Plans DC to home with HHPT vs OPPT. Pt admitted with above diagnosis. Pt currently with functional limitations due to the deficits listed below (see PT Problem List).  Pt will benefit from skilled PT to increase their independence and safety with mobility to allow discharge to the venue listed below.       Follow Up Recommendations Home health PT;Supervision/Assistance - 24 hour    Equipment Recommendations  None recommended by PT    Recommendations for Other Services       Precautions / Restrictions Precautions Precautions: Fall;Posterior Hip Restrictions Weight Bearing Restrictions: No RLE Weight Bearing: Weight bearing as tolerated      Mobility  Bed Mobility Overal bed mobility: Needs Assistance Bed Mobility: Supine to Sit;Sit to Supine     Supine to sit: Min assist Sit to supine: Mod assist   General bed mobility comments: assist with R leg, cues for precautions  Transfers Overall transfer level: Needs assistance Equipment used: Rolling walker (2 wheeled) Transfers: Sit to/from Stand Sit to Stand: Min assist         General transfer comment: cues for hand and R leg position,  Ambulation/Gait Ambulation/Gait assistance: Min assist Ambulation Distance (Feet): 60 Feet Assistive device: Rolling walker (2 wheeled) Gait Pattern/deviations: Step-to pattern     General Gait Details: cues for sequence and hand grips on RW  Stairs            Wheelchair Mobility    Modified Rankin (Stroke Patients Only)       Balance                                             Pertinent Vitals/Pain Pain Assessment: 0-10 Pain Score: 6   Pain Location: R hip Pain Descriptors / Indicators: Aching;Cramping;Discomfort;Sore Pain Intervention(s): Limited activity within patient's tolerance;Monitored during session;Premedicated before session;Repositioned;Ice applied    Home Living Family/patient expects to be discharged to:: Private residence Living Arrangements: Spouse/significant other Available Help at Discharge: Family Type of Home: House Home Access: Stairs to enter   Enter[employer] CorporationEntrance Stairs-Number of Steps: 2 with hand grip on post to hold onto Home Layout: One level Home Equipment: Environmental consultantWalker - 2 wheels;Bedside commode      Prior Function                 Hand Dominance        Extremity/Trunk Assessment   Upper Extremity Assessment: Defer to OT evaluation           Lower Extremity Assessment: RLE deficits/detail RLE Deficits / Details: flexes hip in bed, advances the leg    Cervical / Trunk Assessment: Normal  Communication      Cognition Arousal/Alertness: Awake/alert Behavior During Therapy: WFL for tasks assessed/performed Overall Cognitive Status: Within Functional Limits for tasks assessed                      General Comments      Exercises        Assessment/Plan    PT Assessment Patient needs continued PT services  PT Diagnosis Difficulty walking;Generalized weakness   PT Problem List  Decreased strength;Decreased range of motion;Decreased activity tolerance;Decreased mobility;Decreased safety awareness;Decreased knowledge of use of DME;Decreased cognition  PT Treatment Interventions DME instruction;Gait training;Stair training;Functional mobility training;Therapeutic activities;Therapeutic exercise;Patient/family education   PT Goals (Current goals can be found in the Care Plan section) Acute Rehab PT Goals Patient Stated Goal: to go home PT Goal Formulation: With patient/family Time For Goal Achievement: 12/17/15 Potential to Achieve Goals: Good    Frequency 7X/week    Barriers to discharge        Co-evaluation               End of Session Equipment Utilized During Treatment: Gait belt Activity Tolerance: Patient tolerated treatment well Patient left: in bed;with call bell/phone within reach;with family/visitor present Nurse Communication: Mobility status         Time: 9811-9147 PT Time Calculation (min) (ACUTE ONLY): 22 min   Charges:   PT Evaluation $PT Eval Low Complexity: 1 Procedure     PT G CodesRada Hay 12/12/2015, 5:46 PM Blanchard Kelch PT [phone]

## 2015-12-12 NOTE — Anesthesia Postprocedure Evaluation (Signed)
Anesthesia Post Note  Patient: Joanna Jones  Procedure(s) Performed: Procedure(s) (LRB): CONVERSION RIGHT HEMI ARTHROPLASTY TO TOTAL HIP ARTHROPLASTY POSTERIOR APPROACH (Right)  Patient location during evaluation: PACU Anesthesia Type: Spinal Level of consciousness: awake and alert Pain management: pain level controlled Vital Signs Assessment: post-procedure vital signs reviewed and stable Respiratory status: spontaneous breathing, nonlabored ventilation, respiratory function stable and patient connected to nasal cannula oxygen Cardiovascular status: blood pressure returned to baseline and stable Postop Assessment: no signs of nausea or vomiting and spinal receding Anesthetic complications: no    Last Vitals:  Filed Vitals:   12/12/15 1303 12/12/15 1410  BP: 134/71 127/60  Pulse: 89 100  Temp: 36.4 C 36.8 C  Resp: 16 16    Last Pain:  Filed Vitals:   12/12/15 1415  PainSc: 1                  [name]

## 2015-12-12 NOTE — Transfer of Care (Signed)
Immediate Anesthesia Transfer of Care Note  Patient: Joanna Jones  Procedure(s) Performed: Procedure(s): CONVERSION RIGHT HEMI ARTHROPLASTY TO TOTAL HIP ARTHROPLASTY POSTERIOR APPROACH (Right)  Patient Location: PACU  Anesthesia Type:Spinal  Level of Consciousness:  sedated, patient cooperative and responds to stimulation  Airway & Oxygen Therapy:Patient Spontanous Breathing and Patient connected to face mask oxgen  Post-op Assessment:  Report given to PACU RN and Post -op Vital signs reviewed and stable  Post vital signs:  Reviewed and stable  Last Vitals:  Filed Vitals:   12/12/15 0613  BP: 153/85  Pulse: 70  Temp: 36.7 C  Resp: 16    Complications: No apparent anesthesia complications

## 2015-12-12 NOTE — Anesthesia Procedure Notes (Signed)
Spinal Patient location during procedure: OR Start time: 12/12/2015 7:32 AM End time: 12/12/2015 7:34 AM Reason for block: at surgeon's request Staffing Resident/CRNA: Freddie Breech Performed by: resident/CRNA  Preanesthetic Checklist Completed: patient identified, site marked, surgical consent, pre-op evaluation, timeout performed, IV checked, risks and benefits discussed, monitors and equipment checked and at surgeon's request Spinal Block Patient position: sitting Prep: ChloraPrep Patient monitoring: heart rate, continuous pulse ox and blood pressure Approach: midline Location: L3-4 Injection technique: single-shot Needle Needle type: Sprotte  Needle gauge: 24 G Needle length: 10 cm Assessment Sensory level: T6 Additional Notes Expiration date of kit checked and confirmed. Patient tolerated procedure well, without complications. X 1 attempt with noted clear CSF return. Loss of motor and sensory on exam post injection.

## 2015-12-12 NOTE — Progress Notes (Signed)
Utilization review completed.  

## 2015-12-12 NOTE — Interval H&P Note (Signed)
History and Physical Interval Note:  12/12/2015 7:13 AM  Joanna Jones  has presented today for surgery, with the diagnosis of RIGHT FAILED HEMI ARTHROPLASTY  The various methods of treatment have been discussed with the patient and family. After consideration of risks, benefits and other options for treatment, the patient has consented to  Procedure(s): CONVERSION RIGHT HEMI ARTHROPLASTY TO TOTAL HIP ARTHROPLASTY POSTERIOR APPROACH (Right) as a surgical intervention .  The patient's history has been reviewed, patient examined, no change in status, stable for surgery.  I have reviewed the patient's chart and labs.  Questions were answered to the patient's satisfaction.     Shelda PalLIN,Oni Dietzman D

## 2015-12-12 NOTE — Brief Op Note (Signed)
12/12/2015  8:47 AM  PATIENT:  Royetta AsalGlenda F Swords  70 y.o. female  PRE-OPERATIVE DIAGNOSIS:  RIGHT FAILED HEMI ARTHROPLASTY  POST-OPERATIVE DIAGNOSIS:  RIGHT FAILED HEMI ARTHROPLASTY  PROCEDURE:  Procedure(s): CONVERSION RIGHT HEMI ARTHROPLASTY TO TOTAL HIP ARTHROPLASTY POSTERIOR APPROACH (Right)  SURGEON:  Surgeon(s) and Role:    * Durene RomansMatthew Nicholaus Steinke, MD - Primary  PHYSICIAN ASSISTANT: Lanney GinsMatthew Babish, PA-C  ANESTHESIA:   spinal  EBL:  Total I/O In: 2000 [I.V.:2000] Out: 650 [Urine:500; Blood:150]  BLOOD ADMINISTERED:none  DRAINS: none   LOCAL MEDICATIONS USED:  NONE  SPECIMEN:  No Specimen  DISPOSITION OF SPECIMEN:  N/A  COUNTS:  YES  TOURNIQUET:  * No tourniquets in log *  DICTATION: .Other Dictation: Dictation Number [id number]  PLAN OF CARE: Admit to inpatient   PATIENT DISPOSITION:  PACU - hemodynamically stable.   Delay start of Pharmacological VTE agent (>24hrs) due to surgical blood loss or risk of bleeding: no

## 2015-12-13 LAB — CBC
HCT: 30.7 % — ABNORMAL LOW (ref 36.0–46.0)
Hemoglobin: 10 g/dL — ABNORMAL LOW (ref 12.0–15.0)
MCH: 29.4 pg (ref 26.0–34.0)
MCHC: 32.6 g/dL (ref 30.0–36.0)
MCV: 90.3 fL (ref 78.0–100.0)
PLATELETS: 133 10*3/uL — AB (ref 150–400)
RBC: 3.4 MIL/uL — AB (ref 3.87–5.11)
RDW: 13.4 % (ref 11.5–15.5)
WBC: 6.6 10*3/uL (ref 4.0–10.5)

## 2015-12-13 LAB — BASIC METABOLIC PANEL
Anion gap: 5 (ref 5–15)
BUN: 7 mg/dL (ref 6–20)
CO2: 26 mmol/L (ref 22–32)
Calcium: 8.2 mg/dL — ABNORMAL LOW (ref 8.9–10.3)
Chloride: 108 mmol/L (ref 101–111)
Creatinine, Ser: 0.44 mg/dL (ref 0.44–1.00)
GFR calc Af Amer: >60 mL/min (ref >60)
GLUCOSE: 111 mg/dL — AB (ref 65–99)
POTASSIUM: 3.9 mmol/L (ref 3.5–5.1)
Sodium: 139 mmol/L (ref 135–145)

## 2015-12-13 MED ORDER — DOCUSATE SODIUM 100 MG PO CAPS: 100.0000 mg | ORAL_CAPSULE | Freq: Two times a day (BID) | ORAL | Status: DC

## 2015-12-13 MED ORDER — ASPIRIN 325 MG PO TBEC: 325.0000 mg | DELAYED_RELEASE_TABLET | Freq: Two times a day (BID) | ORAL | Status: AC

## 2015-12-13 MED ORDER — POLYETHYLENE GLYCOL 3350 17 G PO PACK: 17.0000 g | PACK | Freq: Two times a day (BID) | ORAL | Status: DC

## 2015-12-13 MED ORDER — FERROUS SULFATE 325 (65 FE) MG PO TABS: 325.0000 mg | ORAL_TABLET | Freq: Three times a day (TID) | ORAL | Status: DC

## 2015-12-13 MED ORDER — METHOCARBAMOL 500 MG PO TABS: 500.0000 mg | ORAL_TABLET | Freq: Four times a day (QID) | ORAL | Status: DC | PRN

## 2015-12-13 MED ORDER — HYDROCODONE-ACETAMINOPHEN 7.5-325 MG PO TABS: 1.0000 | ORAL_TABLET | ORAL | Status: DC | PRN

## 2015-12-13 NOTE — Evaluation (Addendum)
Occupational Therapy Evaluation Patient Details Name: Joanna AsalGlenda F Mossbarger MRN: [id number] DOB: 08-17-1945 Today's Date: 12/13/2015    History of Present Illness conversion R hip hemiarthroplasty to posterior THA 12/12/15   Clinical Impression   This 70 year old female was admitted for the above surgery. She will benefit from continued OT while in acute setting to reinforce education and practice shower transfer    Follow Up Recommendations  Supervision/Assistance - 24 hour    Equipment Recommendations  None recommended by OT (has 3:1)    Recommendations for Other Services       Precautions / Restrictions Precautions Precautions: Fall;Posterior Hip Precaution Booklet Issued: Yes (comment) Restrictions Weight Bearing Restrictions: No RLE Weight Bearing: Weight bearing as tolerated      Mobility Bed Mobility         Supine to sit: Mod assist     General bed mobility comments: extra time.  assist for RLE and trunk  Transfers   Equipment used: Rolling walker (2 wheeled) Transfers: Sit to/from Stand Sit to Stand: Min assist         General transfer comment: cues for hand and R leg position,    Balance                                            ADL Overall ADL's : Needs assistance/impaired     Grooming: Set up;Supervision/safety;Sitting   Upper Body Bathing: Set up;Supervision/ safety;Sitting   Lower Body Bathing: Maximal assistance;Sit to/from stand   Upper Body Dressing : Supervision/safety;Sitting   Lower Body Dressing: Maximal assistance;Sit to/from stand   Toilet Transfer: Minimal assistance;Ambulation;BSC;RW             General ADL Comments: husband will assist with adls.  Pt has a reacher from last sx.  Reviewed thps (posterior) and gave handouts.  Pt anxious but did better as session progressed.  Handout given for shower,.     Vision     Perception     Praxis      Pertinent Vitals/Pain Pain Score: 6  Pain  Location: right hip Pain Descriptors / Indicators: Aching Pain Intervention(s): Limited activity within patient's tolerance;Monitored during session;Premedicated before session;Repositioned;Ice applied     Hand Dominance     Extremity/Trunk Assessment Upper Extremity Assessment Upper Extremity Assessment: Overall WFL for tasks assessed.  Pt c/o soreness RUE upper arm--h/o fx           Communication Communication Communication: No difficulties   Cognition Arousal/Alertness: Awake/alert Behavior During Therapy: Anxious Overall Cognitive Status: Within Functional Limits for tasks assessed                     General Comments       Exercises       Shoulder Instructions      Home Living Family/patient expects to be discharged to:: Private residence Living Arrangements: Spouse/significant other Available Help at Discharge: Family               Bathroom Shower/Tub: Walk-in Human resources officershower   Bathroom Toilet: Standard     Home Equipment: Environmental consultantWalker - 2 wheels;Bedside commode          Prior Functioning/Environment Level of Independence: Independent        Comments: has been having pain    OT Diagnosis: Acute pain;Generalized weakness   OT Problem List: Decreased strength;Decreased activity tolerance;Decreased knowledge of  precautions;Decreased knowledge of use of DME or AE;Pain   OT Treatment/Interventions: Self-care/ADL training;DME and/or AE instruction;Patient/family education    OT Goals(Current goals can be found in the care plan section) Acute Rehab OT Goals Patient Stated Goal: less pain OT Goal Formulation: With patient Time For Goal Achievement: 12/20/15 Potential to Achieve Goals: Good ADL Goals Pt Will Transfer to Toilet: with min guard assist;bedside commode;ambulating Pt Will Perform Tub/Shower Transfer: Shower transfer;with min guard assist;ambulating;3 in 1 Additional ADL Goal #1: pt will not need cues for posterior THPs during adls/bathroom  transfers  OT Frequency: Min 2X/week   Barriers to D/C:            Co-evaluation              End of Session    Activity Tolerance: Patient limited by pain;Patient limited by fatigue Patient left: in chair;with call bell/phone within reach;with chair alarm set;with family/visitor present   Time: 6295-2841 OT Time Calculation (min): 34 min Charges:  OT General Charges $OT Visit: 1 Procedure OT Evaluation $OT Eval Low Complexity: 1 Procedure OT Treatments $Self Care/Home Management : 8-22 mins G-Codes:    Desirey Keahey 01-10-2016, 10:27 AM [name], OTR/L [phone] 01-10-2016

## 2015-12-13 NOTE — Progress Notes (Signed)
Physical Therapy Treatment Patient Details Name: Joanna Jones MRN: [id number] DOB: 11-21-1945 Today's Date: 12/13/2015    History of Present Illness conversion R hip hemiarthroplasty to posterior THA 12/12/15    PT Comments    POD # 1 Assisted with amb to bathroom with VC's on safety and THP.  Assisted with practicing stairs with spouse.  Performed THR TE's following HEP.  All mobility questions addressed.  Pt ready for D/C to home.   Follow Up Recommendations  Home health PT;Supervision/Assistance - 24 hour     Equipment Recommendations  None recommended by PT (has everything from prior hip surgery)    Recommendations for Other Services       Precautions / Restrictions Precautions Precautions: Fall;Posterior Hip Precaution Booklet Issued: Yes (comment) Precaution Comments: reviewed and instructed  Restrictions Weight Bearing Restrictions: No RLE Weight Bearing: Weight bearing as tolerated    Mobility  Bed Mobility               General bed mobility comments: Pt OOB in recliner  Transfers Overall transfer level: Needs assistance Equipment used: Rolling walker (2 wheeled) Transfers: Sit to/from Stand Sit to Stand: Min guard         General transfer comment: 50% VC's to avoid hip flex > 90 degrees.  25% VC's on proper hand placement and safety with turn completion.    Ambulation/Gait Ambulation/Gait assistance: Min guard Ambulation Distance (Feet): 72 Feet Assistive device: Rolling walker (2 wheeled) Gait Pattern/deviations: Step-to pattern;Trunk flexed Gait velocity: decreased   General Gait Details: cues for sequence and hand grips on RW   Stairs Stairs: Yes Stairs assistance: Min assist Stair Management: Two rails;Forwards Number of Stairs: 2 General stair comments: with spouse present performed with 25% VC's on proper sequencing and safety.    Wheelchair Mobility    Modified Rankin (Stroke Patients Only)       Balance                                     Cognition Arousal/Alertness: Awake/alert Behavior During Therapy: Anxious Overall Cognitive Status: Within Functional Limits for tasks assessed (required increased VC's)                      Exercises      General Comments        Pertinent Vitals/Pain Pain Assessment: 0-10 Pain Score: 6  Pain Location: R hip Pain Descriptors / Indicators: Grimacing;Aching;Sore;Tender Pain Intervention(s): Monitored during session;Premedicated before session;Repositioned;Ice applied    Home Living                      Prior Function            PT Goals (current goals can now be found in the care plan section) Acute Rehab PT Goals Patient Stated Goal: less pain Progress towards PT goals: Progressing toward goals    Frequency  7X/week    PT Plan Current plan remains appropriate    Co-evaluation             End of Session Equipment Utilized During Treatment: Gait belt Activity Tolerance: Patient tolerated treatment well Patient left: in chair;with call bell/phone within reach     Time: 1106-1140 PT Time Calculation (min) (ACUTE ONLY): 34 min  Charges:  $Gait Training: 8-22 mins $Therapeutic Exercise: 8-22 mins  G Codes:      Rica Koyanagi  PTA [facility]  Acute  Rehab Pager      [phone]

## 2015-12-13 NOTE — Discharge Instructions (Signed)

## 2015-12-13 NOTE — Progress Notes (Signed)
CSW consulted for SNF placement. PN reviewed. PT as recommended HHPT at d/c. RNCM will assist with d/c planning needs.  Cori RazorJamie Daden Mahany LCSW [phone]

## 2015-12-13 NOTE — Progress Notes (Signed)
Patient ID: Joanna AsalGlenda F Bucknam, female   DOB: February 24, 1946, 70 y.o.   MRN: [id number] Subjective: 1 Day Post-Op Procedure(s) (LRB): CONVERSION RIGHT HEMI ARTHROPLASTY TO TOTAL HIP ARTHROPLASTY POSTERIOR APPROACH (Right)    Patient reports pain as mild at rest, but anxious about movement as this is what hurt before procedure.  Reports more anterior thigh pain currently versus her pre-operative groin pain, medial thigh pain. No events over night, husband by bedside  Objective:   VITALS:   Filed Vitals:   12/13/15 0503 12/13/15 1020  BP: 124/63 119/53  Pulse: 61 62  Temp: 97.7 F (36.5 C) 98.4 F (36.9 C)  Resp: 16 18    Neurovascular intact Incision: dressing C/D/I  LABS  Recent Labs  12/13/15 0354  HGB 10.0*  HCT 30.7*  WBC 6.6  PLT 133*     Recent Labs  12/13/15 0354  NA 139  K 3.9  BUN 7  CREATININE 0.44  GLUCOSE 111*    No results for input(s): LABPT, INR in the last 72 hours.   Assessment/Plan: 1 Day Post-Op Procedure(s) (LRB): CONVERSION RIGHT HEMI ARTHROPLASTY TO TOTAL HIP ARTHROPLASTY POSTERIOR APPROACH (Right)   Advance diet Up with therapy Discharge home with home health today versus tomorrow depending on progress with therapy  Reviewed intra-operative findings and procedures performed RTC in 2 weeks

## 2015-12-13 NOTE — Care Management Note (Signed)
Case Management Note  Patient Details  Name: Joanna Jones MRN: [id number] Date of Birth: 1946/02/04  Subjective/Objective: s/p Conversion of right hip hemiarthroplasty, Right total hip arthroplasty.                   Action/Plan: Discharge planning, spoke with patient and spouse at beside. [facility] Has RW and 3-n-1.   Expected Discharge Date:                  Expected Discharge Plan:  Home w Home Health Services  In-House Referral:  NA  Discharge planning Services  CM Consult  Post Acute Care Choice:  Home Health Choice offered to:  Patient  DME Arranged:  N/A DME Agency:  NA  HH Arranged:  PT HH Agency:  [facility]  Status of Service:  Completed, signed off  Medicare Important Message Given:    Date Medicare IM Given:    Medicare IM give by:    Date Additional Medicare IM Given:    Additional Medicare Important Message give by:     If discussed at Long Length of Stay Meetings, dates discussed:    Additional Comments:  Alexis Goodelleele, Shalae Belmonte K, RN 12/13/2015, 11:05 AM [phone]

## 2015-12-13 NOTE — Op Note (Signed)
NAMLexine Baton:  Jones, Joanna               ACCOUNT NO.:  [id number]  MEDICAL RECORD NO.:  [id number]  LOCATION:  1601                         [facility]  PHYSICIAN:  Madlyn FrankelMatthew D. Charlann Boxerlin, M.D.  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  PROCEDURE: 1. Conversion of right hip hemiarthroplasty. 2. Right total hip arthroplasty.  COMPONENTS USE:  DePuy size 52 mm Pinnacle shell with 36+ 4 neutral AltrX liner and a 36+ 5 titanium sleeve ceramic ball.  SURGEON:  Madlyn FrankelMatthew D. Charlann Boxerlin, M.D.  ASSISTANT:  Lanney GinsMatthew Babish, PA-C.  Note that, [name] was present for the entirety of the case from preoperative position, perioperative management of the operative extremity, general facilitation of the case, and primary wound closure.  ANESTHESIA:  Spinal.  SPECIMENS:  None.  COMPLICATIONS:  None.  BLOOD LOSS:  About 150 mL.  INDICATIONS FOR THE PROCEDURE:  Joanna Jones is a 70 year old female with a history of right total hip arthroplasty within the last 9 months.  She had been seen and evaluated in referral evaluation for painful right hip.  Radiographs revealed very limited joint space.  She had weightbearing pain.  She had no clinical concern for infection nor any significant findings for positive infection.  Her pain was located to the anterior aspect of the hip consistent with that of acetabular based pain.  After multiple attempts and discussion of conservative treatment and failing this, she wished to proceed with surgical intervention.  We discussed the benefits of hip arthroplasty and eliminating this as a potential source of pain, we discussed that there could be some residual pain given the  nature of her presentation with femoral neck fracture. We discussed risks of infection and DVT.  We discussed component failure, need for future surgery.  We discussed the risk of dislocation in this type of surgery.  Consent was obtained for benefit of pain relief.  PROCEDURE IN DETAIL:  The patient was brought to the operative theater. Once adequate anesthesia, preoperative antibiotics, Ancef, 1 g of tranexamic acid, and 10 mg of Decadron administered.  She was positioned into the left lateral decubitus position with the right side up.  The right lower extremity was then prepped and draped in sterile fashion demarcating her old incision.  Following a time-out, identifying the patient, planned procedure, and extremity, the patient's old incision was excised, soft tissue planes were created down to the level of the iliotibial band and gluteal fascia.  Previously recognized Ethibond sutures were incised and then removed.  The posterior aspect of the hip was exposed.  We cannot encounter clear synovial fluid.  The most significant finding that I would relate to the family and the patient is that she had a significant amount of scarring.  She may have had just a soft tissue-based reaction to some of the Ethibond sutures that were resulted in abundant scarring in and around the whole hip joint.  As I was able to expose  the posterior two-thirds of the hip, we were then able to dislocate the hip and knee arthroplasty and removed the femoral head.  At this point, after further debridement of the scar tissue around the joint and removing the Ethibond suture, I elevated the soft tissues off the ilium and placed the trunnion onto the ilium and retracted it anteriorly.  Following further exposure, I began reaming with a 45 mm reamer and reamed up to 51 mm reamer with good bony bed preparation.  Final 56 pinnacle cup was selected and impacted.  Based on the orientation of the pelvis, there was  4 or 5 mm of exposed bone on the anterior rim of the acetabulum and exposed cup posterior.  Cup positioner indicated an abduction about 35 to 40 degrees.  I placed a single cancellous screw into the ilium and at this point, a trial reduction with a 36+ 4 neutral trial liner and then first 36, 1.5 ball and then a +5 ball.  The combined anteversion was about 50 degrees with a +1.5 ball.  It was felt there was a little bit of shuck with a +5, this was less.  There was no evidence of any impingement.  Given these findings, the trial components were removed.  The final 36+ 4 neutral AltrX liner was selected.  After placing the hole, eliminator was impacted into the cup with good visualized rim fit.  The final 36+ 5 delta ceramic ball with a titanium sleeve was then impacted on a clean and dry trunnion.  The hip was reduced.  We irrigated the hip throughout the case.  At this point, I reapproximated with a #1 Vicryl to the posterior pseudocapsule.  I then reapproximated the iliotibial band with a combination of #1 Vicryl and 0 V-Loc suture.  The remaining wound was closed with 2-0 Vicryl and 3-0 Monocryl.  The hip was then cleaned, dried, and dressed sterilely using surgical glue and Aquacel dressing.  She was then brought to the recovery room in a stable condition, tolerating the procedure well.  She will be allowed to be weightbearing as tolerated in the postop period with the use of a walker, initially transitioned to a cane.     Madlyn Frankel Charlann Boxer, M.D.     MDO/MEDQ  D:  12/12/2015  T:  12/13/2015  Job:  [id number]

## 2015-12-14 DIAGNOSIS — T8484XD Pain due to internal orthopedic prosthetic devices, implants and grafts, subsequent encounter: Secondary | ICD-10-CM | POA: Diagnosis not present

## 2015-12-14 DIAGNOSIS — Z96641 Presence of right artificial hip joint: Secondary | ICD-10-CM | POA: Diagnosis not present

## 2015-12-19 NOTE — Discharge Summary (Signed)
Physician Discharge Summary  Patient ID: Joanna Jones MRN: [id number] DOB/AGE: Nov 03, 1945 70 y.o.  Admit date: 12/12/2015 Discharge date: 12/13/2015   Procedures:  Procedure(s) (LRB): CONVERSION RIGHT HEMI ARTHROPLASTY TO TOTAL HIP ARTHROPLASTY POSTERIOR APPROACH (Right)  Attending Physician:  Dr. Durene Romans   Admission Diagnoses:   Right hip failed hemiarthroplasty  Discharge Diagnoses:  Principal Problem:   S/P revision right TH  Past Medical History  Diagnosis Date  . Anxiety   . Arthritis     HPI:    Joanna Jones, 69 y.o. female, has a history of pain and functional disability in the right hip due to trauma and patient has failed non-surgical conservative treatments for greater than 12 weeks to include NSAID's and/or analgesics, use of assistive devices and activity modification. The indications for the revision total hip arthroplasty are bearing surface wear leading to symptomatic synovitis. Onset of symptoms was gradual starting 9 months ago with rapidlly worsening course since that time. Prior procedures on the right hip include hemi-arthroplasty. Patient currently rates pain in the right hip at 8 out of 10 with activity. There is worsening of pain with activity and weight bearing, trendelenberg gait, pain that interfers with activities of daily living and pain with passive range of motion. Patient has evidence of previous hemiarthroplasty by imaging studies. This condition presents safety issues increasing the risk of falls.  There is no current active infection. Risks, benefits and expectations were discussed with the patient. Risks including but not limited to the risk of anesthesia, blood clots, nerve damage, blood vessel damage, failure of the prosthesis, infection and up to and including death. Patient understand the risks, benefits and expectations and wishes to proceed with surgery.   PCP: Colette Ribas, MD   Discharged Condition:  good  Hospital Course:  Patient underwent the above stated procedure on 12/12/2015. Patient tolerated the procedure well and brought to the recovery room in good condition and subsequently to the floor.  POD #1 BP: 119/53 ; Pulse: 62 ; Temp: 98.4 F (36.9 C) ; Resp: 18 Patient reports pain as mild at rest, but anxious about movement as this is what hurt before procedure. Reports more anterior thigh pain currently versus her pre-operative groin pain, medial thigh pain.   No events over night, husband by bedside. Neurovascular intact and incision: dressing C/D/I.  LABS  Basename    HGB     10.0  HCT     30.7    Discharge Exam: General appearance: alert, cooperative and no distress Extremities: Homans sign is negative, no sign of DVT, no edema, redness or tenderness in the calves or thighs and no ulcers, gangrene or trophic changes  Disposition: Home with follow up in 2 weeks   Follow-up Information    Follow up with Shelda Pal, MD. Schedule an appointment as soon as possible for a visit in 2 weeks.   Specialty:  Orthopedic Surgery   Contact information:   [address] [place] [address] [phone]       Follow up with [facility].   Why:  physical therapy   Contact information:   [address] [place] [address] [phone]       Discharge Instructions    Call MD / Call 911    Complete by:  As directed   If you experience chest pain or shortness of breath, CALL 911 and be transported to the hospital emergency room.  If you develope a fever  above 101 F, pus (white drainage) or increased drainage or redness at the wound, or calf pain, call your surgeon's office.     Change dressing    Complete by:  As directed   Maintain surgical dressing until follow up in the clinic. If the edges start to pull up, may reinforce with tape. If the dressing is no longer working, may remove and cover with gauze and tape, but must keep the  area dry and clean.  Call with any questions or concerns.     Constipation Prevention    Complete by:  As directed   Drink plenty of fluids.  Prune juice may be helpful.  You may use a stool softener, such as Colace (over the counter) 100 mg twice a day.  Use MiraLax (over the counter) for constipation as needed.     Diet - low sodium heart healthy    Complete by:  As directed      Discharge instructions    Complete by:  As directed   Maintain surgical dressing until follow up in the clinic. If the edges start to pull up, may reinforce with tape. If the dressing is no longer working, may remove and cover with gauze and tape, but must keep the area dry and clean.  Follow up in 2 weeks at [facility]. Call with any questions or concerns.     Follow the hip precautions as taught in Physical Therapy    Complete by:  As directed      Increase activity slowly as tolerated    Complete by:  As directed   Weight bearing as tolerated with assist device (walker, cane, etc) as directed, use it as long as suggested by your surgeon or therapist, typically at least 4-6 weeks.     TED hose    Complete by:  As directed   Use stockings (TED hose) for 2 weeks on both leg(s).  You may remove them at night for sleeping.             Medication List    STOP taking these medications        acetaminophen 500 MG tablet  Commonly known as:  TYLENOL     enoxaparin 40 MG/0.4ML injection  Commonly known as:  LOVENOX     traMADol 50 MG tablet  Commonly known as:  ULTRAM     trolamine salicylate 10 % cream  Commonly known as:  ASPERCREME      TAKE these medications        ARTIFICIAL TEARS 1.4 % ophthalmic solution  Generic drug:  polyvinyl alcohol  Place 1 drop into both eyes 4 (four) times daily as needed for dry eyes.     aspirin 325 MG EC tablet  Take 1 tablet (325 mg total) by mouth 2 (two) times daily.     docusate sodium 100 MG capsule  Commonly known as:  COLACE  Take 1 capsule  (100 mg total) by mouth 2 (two) times daily.     estrogens (conjugated) 1.25 MG tablet  Commonly known as:  PREMARIN  Take 0.625 mg by mouth daily.     ferrous sulfate 325 (65 FE) MG tablet  Take 1 tablet (325 mg total) by mouth 3 (three) times daily after meals.     HYDROcodone-acetaminophen 7.5-325 MG tablet  Commonly known as:  NORCO  Take 1-2 tablets by mouth every 4 (four) hours as needed for moderate pain.     LORazepam 0.5 MG tablet  Commonly known as:  ATIVAN  Take 0.5 mg by mouth every 8 (eight) hours as needed for anxiety.     methocarbamol 500 MG tablet  Commonly known as:  ROBAXIN  Take 1 tablet (500 mg total) by mouth every 6 (six) hours as needed for muscle spasms.     polyethylene glycol packet  Commonly known as:  MIRALAX / GLYCOLAX  Take 17 g by mouth 2 (two) times daily.         Signed: Anastasio AuerbachMatthew S. Synia Douglass   PA-C  12/19/2015, 7:57 AM

## 2015-12-23 DIAGNOSIS — Z96641 Presence of right artificial hip joint: Secondary | ICD-10-CM | POA: Diagnosis not present

## 2015-12-23 DIAGNOSIS — T8484XD Pain due to internal orthopedic prosthetic devices, implants and grafts, subsequent encounter: Secondary | ICD-10-CM | POA: Diagnosis not present

## 2015-12-28 DIAGNOSIS — T8484XD Pain due to internal orthopedic prosthetic devices, implants and grafts, subsequent encounter: Secondary | ICD-10-CM | POA: Diagnosis not present

## 2015-12-28 DIAGNOSIS — Z96641 Presence of right artificial hip joint: Secondary | ICD-10-CM | POA: Diagnosis not present

## 2016-01-25 DIAGNOSIS — Z96641 Presence of right artificial hip joint: Secondary | ICD-10-CM | POA: Diagnosis not present

## 2016-01-25 DIAGNOSIS — Z471 Aftercare following joint replacement surgery: Secondary | ICD-10-CM | POA: Diagnosis not present

## 2016-03-07 DIAGNOSIS — Z96641 Presence of right artificial hip joint: Secondary | ICD-10-CM | POA: Diagnosis not present

## 2016-03-07 DIAGNOSIS — Z471 Aftercare following joint replacement surgery: Secondary | ICD-10-CM | POA: Diagnosis not present

## 2016-05-01 DIAGNOSIS — H2513 Age-related nuclear cataract, bilateral: Secondary | ICD-10-CM | POA: Diagnosis not present

## 2016-05-01 DIAGNOSIS — H524 Presbyopia: Secondary | ICD-10-CM | POA: Diagnosis not present

## 2016-05-01 DIAGNOSIS — H01001 Unspecified blepharitis right upper eyelid: Secondary | ICD-10-CM | POA: Diagnosis not present

## 2016-05-01 DIAGNOSIS — H04123 Dry eye syndrome of bilateral lacrimal glands: Secondary | ICD-10-CM | POA: Diagnosis not present

## 2016-07-29 IMAGING — NM NM BONE 3 PHASE
2 series · 12 of 12 positions shown · non-contrast
Comparison: 03/05/2015

CLINICAL DATA: History of right femoral neck fracture with
subsequent right hip bipolar hemiarthroplasty.

EXAM:
NUCLEAR MEDICINE 3-PHASE BONE SCAN
TECHNIQUE: Radionuclide angiographic images, immediate static blood pool
images, and 3-hour delayed static images were obtained of the pelvis
after intravenous injection of radiopharmaceutical.
RADIOPHARMACEUTICALS:  25.0 mCi Gc-JJm MDP

[Series 1: flow · 4.14mm/px · 6 of 40 frames shown (1 of 2)]
[frame 4/40]
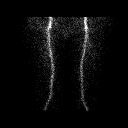
[frame 10/40]
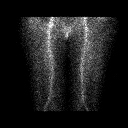
[frame 17/40]
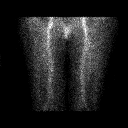
[frame 24/40]
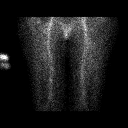
[frame 30/40]
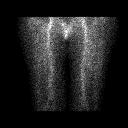
[frame 37/40]
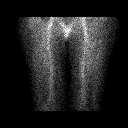

[Series 1: flow · 4.14mm/px · 6 of 40 frames shown (2 of 2)]
[frame 4/40]
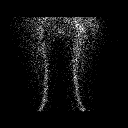
[frame 10/40]
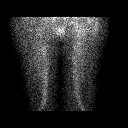
[frame 17/40]
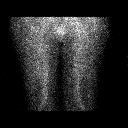
[frame 24/40]
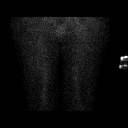
[frame 30/40]
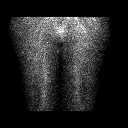
[frame 37/40]
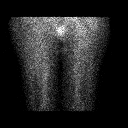

[12 of 12 positions shown; findings below may reference images not displayed]

FINDINGS: Vascular phase: There is symmetric profusion to both hips.

Blood pool phase: No abnormal blood pool activity identified. There
is a photopenic defect which corresponds to the right hip
arthroplasty device.

Delayed phase: There is a small focus of mild increased uptake
around the tip of the femoral component of the right hip
arthroplasty device. There is also mild increased uptake localizing
to the trochanteric portions of the proximal right femur.
IMPRESSION: 1. No evidence for arthroplasty device loosening or infection.
2. Mild nonspecific increased uptake surrounding the proximal and
distal portions of the femoral component of the right hip
arthroplasty device are noted on the delayed phase images only.

## 2016-12-12 DIAGNOSIS — Z471 Aftercare following joint replacement surgery: Secondary | ICD-10-CM | POA: Diagnosis not present

## 2016-12-12 DIAGNOSIS — Z96641 Presence of right artificial hip joint: Secondary | ICD-10-CM | POA: Diagnosis not present

## 2017-01-29 DIAGNOSIS — Z6824 Body mass index (BMI) 24.0-24.9, adult: Secondary | ICD-10-CM | POA: Diagnosis not present

## 2017-01-29 DIAGNOSIS — L509 Urticaria, unspecified: Secondary | ICD-10-CM | POA: Diagnosis not present

## 2017-01-29 DIAGNOSIS — Z1389 Encounter for screening for other disorder: Secondary | ICD-10-CM | POA: Diagnosis not present

## 2017-02-17 DIAGNOSIS — S199XXA Unspecified injury of neck, initial encounter: Secondary | ICD-10-CM | POA: Diagnosis not present

## 2017-02-17 DIAGNOSIS — M542 Cervicalgia: Secondary | ICD-10-CM | POA: Diagnosis not present

## 2017-02-17 DIAGNOSIS — M545 Low back pain: Secondary | ICD-10-CM | POA: Diagnosis not present

## 2017-02-17 DIAGNOSIS — S069X9A Unspecified intracranial injury with loss of consciousness of unspecified duration, initial encounter: Secondary | ICD-10-CM | POA: Diagnosis not present

## 2017-02-17 DIAGNOSIS — G4489 Other headache syndrome: Secondary | ICD-10-CM | POA: Diagnosis not present

## 2017-02-17 DIAGNOSIS — S3992XA Unspecified injury of lower back, initial encounter: Secondary | ICD-10-CM | POA: Diagnosis not present

## 2017-02-17 DIAGNOSIS — M546 Pain in thoracic spine: Secondary | ICD-10-CM | POA: Diagnosis not present

## 2017-02-17 DIAGNOSIS — S098XXA Other specified injuries of head, initial encounter: Secondary | ICD-10-CM | POA: Diagnosis not present

## 2017-02-17 DIAGNOSIS — R42 Dizziness and giddiness: Secondary | ICD-10-CM | POA: Diagnosis not present

## 2017-02-17 DIAGNOSIS — S060X1A Concussion with loss of consciousness of 30 minutes or less, initial encounter: Secondary | ICD-10-CM | POA: Diagnosis not present

## 2017-02-17 DIAGNOSIS — Z79890 Hormone replacement therapy (postmenopausal): Secondary | ICD-10-CM | POA: Diagnosis not present

## 2017-05-03 DIAGNOSIS — H53001 Unspecified amblyopia, right eye: Secondary | ICD-10-CM | POA: Diagnosis not present

## 2017-05-03 DIAGNOSIS — H25813 Combined forms of age-related cataract, bilateral: Secondary | ICD-10-CM | POA: Diagnosis not present

## 2017-05-03 DIAGNOSIS — H25013 Cortical age-related cataract, bilateral: Secondary | ICD-10-CM | POA: Diagnosis not present

## 2017-05-03 DIAGNOSIS — H5203 Hypermetropia, bilateral: Secondary | ICD-10-CM | POA: Diagnosis not present

## 2017-12-10 DIAGNOSIS — J301 Allergic rhinitis due to pollen: Secondary | ICD-10-CM | POA: Diagnosis not present

## 2017-12-10 DIAGNOSIS — Z6825 Body mass index (BMI) 25.0-25.9, adult: Secondary | ICD-10-CM | POA: Diagnosis not present

## 2017-12-10 DIAGNOSIS — F411 Generalized anxiety disorder: Secondary | ICD-10-CM | POA: Diagnosis not present

## 2018-01-15 DIAGNOSIS — J029 Acute pharyngitis, unspecified: Secondary | ICD-10-CM | POA: Diagnosis not present

## 2018-01-15 DIAGNOSIS — Z6825 Body mass index (BMI) 25.0-25.9, adult: Secondary | ICD-10-CM | POA: Diagnosis not present

## 2018-01-15 DIAGNOSIS — J0100 Acute maxillary sinusitis, unspecified: Secondary | ICD-10-CM | POA: Diagnosis not present

## 2018-01-20 DIAGNOSIS — H9202 Otalgia, left ear: Secondary | ICD-10-CM | POA: Diagnosis not present

## 2018-01-20 DIAGNOSIS — R030 Elevated blood-pressure reading, without diagnosis of hypertension: Secondary | ICD-10-CM | POA: Diagnosis not present

## 2018-01-20 DIAGNOSIS — Z6825 Body mass index (BMI) 25.0-25.9, adult: Secondary | ICD-10-CM | POA: Diagnosis not present

## 2018-01-20 DIAGNOSIS — J0100 Acute maxillary sinusitis, unspecified: Secondary | ICD-10-CM | POA: Diagnosis not present

## 2018-02-05 DIAGNOSIS — I10 Essential (primary) hypertension: Secondary | ICD-10-CM | POA: Diagnosis not present

## 2018-02-05 DIAGNOSIS — Z6824 Body mass index (BMI) 24.0-24.9, adult: Secondary | ICD-10-CM | POA: Diagnosis not present

## 2018-05-21 DIAGNOSIS — M13841 Other specified arthritis, right hand: Secondary | ICD-10-CM | POA: Diagnosis not present

## 2018-05-21 DIAGNOSIS — M79641 Pain in right hand: Secondary | ICD-10-CM | POA: Diagnosis not present

## 2018-06-19 DIAGNOSIS — I10 Essential (primary) hypertension: Secondary | ICD-10-CM | POA: Diagnosis not present

## 2018-06-19 DIAGNOSIS — R030 Elevated blood-pressure reading, without diagnosis of hypertension: Secondary | ICD-10-CM | POA: Diagnosis not present

## 2018-06-19 DIAGNOSIS — F411 Generalized anxiety disorder: Secondary | ICD-10-CM | POA: Diagnosis not present

## 2018-06-23 DIAGNOSIS — Z0000 Encounter for general adult medical examination without abnormal findings: Secondary | ICD-10-CM | POA: Diagnosis not present

## 2018-06-23 DIAGNOSIS — Z6824 Body mass index (BMI) 24.0-24.9, adult: Secondary | ICD-10-CM | POA: Diagnosis not present

## 2018-06-23 DIAGNOSIS — I10 Essential (primary) hypertension: Secondary | ICD-10-CM | POA: Diagnosis not present

## 2018-09-13 DIAGNOSIS — Z6825 Body mass index (BMI) 25.0-25.9, adult: Secondary | ICD-10-CM | POA: Diagnosis not present

## 2018-09-13 DIAGNOSIS — J0100 Acute maxillary sinusitis, unspecified: Secondary | ICD-10-CM | POA: Diagnosis not present

## 2018-12-17 DIAGNOSIS — J301 Allergic rhinitis due to pollen: Secondary | ICD-10-CM | POA: Diagnosis not present

## 2018-12-17 DIAGNOSIS — Z6825 Body mass index (BMI) 25.0-25.9, adult: Secondary | ICD-10-CM | POA: Diagnosis not present

## 2018-12-17 DIAGNOSIS — Z0000 Encounter for general adult medical examination without abnormal findings: Secondary | ICD-10-CM | POA: Diagnosis not present

## 2018-12-17 DIAGNOSIS — F411 Generalized anxiety disorder: Secondary | ICD-10-CM | POA: Diagnosis not present

## 2018-12-17 DIAGNOSIS — I10 Essential (primary) hypertension: Secondary | ICD-10-CM | POA: Diagnosis not present

## 2019-04-27 ENCOUNTER — Telehealth: Payer: Self-pay | Admitting: Adult Health

## 2019-04-27 NOTE — Telephone Encounter (Signed)
Called patient regarding appointment and the following message was left: ° ° °We have you scheduled for an upcoming appointment at our office. At this time, patients are encouraged to come alone to their visits whenever possible, however, a support person, over age 73, may accompany you to your appointment if assistance is needed for safety or care concerns. Otherwise, support persons should remain outside until the visit is complete.  ° °We ask if you have had any exposure to anyone suspected or confirmed of having COVID-19 or if you are experiencing any of the following, to call and reschedule your appointment: fever, cough, shortness of breath, muscle pain, diarrhea, rash, vomiting, abdominal pain, red eye, weakness, bruising, bleeding, joint pain, or a severe headache.  ° °Please know we will ask you these questions or similar questions when you arrive for your appointment and again it’s how we are keeping everyone safe.   ° °Also,to keep you safe, please use the provided hand sanitizer when you enter the office. We are asking everyone in the office to wear a mask to help prevent the spread of °germs. If you have a mask of your own, please wear it to your appointment, if not, we are happy to provide one for you. ° °Thank you for understanding and your cooperation.  ° ° °[facility] Staff ° ° ° ° ° °

## 2019-04-28 ENCOUNTER — Encounter: Payer: Self-pay | Admitting: Adult Health

## 2019-04-28 ENCOUNTER — Other Ambulatory Visit: Payer: Self-pay

## 2019-04-28 ENCOUNTER — Ambulatory Visit (INDEPENDENT_AMBULATORY_CARE_PROVIDER_SITE_OTHER): Payer: PPO | Admitting: Adult Health

## 2019-04-28 VITALS — BP 155/74 | HR 61 | Ht 64.0 in | Wt 153.0 lb

## 2019-04-28 DIAGNOSIS — Z01419 Encounter for gynecological examination (general) (routine) without abnormal findings: Secondary | ICD-10-CM | POA: Diagnosis not present

## 2019-04-28 DIAGNOSIS — Z1212 Encounter for screening for malignant neoplasm of rectum: Secondary | ICD-10-CM

## 2019-04-28 DIAGNOSIS — Z79899 Other long term (current) drug therapy: Secondary | ICD-10-CM | POA: Insufficient documentation

## 2019-04-28 DIAGNOSIS — Z1211 Encounter for screening for malignant neoplasm of colon: Secondary | ICD-10-CM | POA: Insufficient documentation

## 2019-04-28 DIAGNOSIS — R1032 Left lower quadrant pain: Secondary | ICD-10-CM | POA: Insufficient documentation

## 2019-04-28 DIAGNOSIS — R232 Flushing: Secondary | ICD-10-CM

## 2019-04-28 LAB — HEMOCCULT GUIAC POC 1CARD (OFFICE): Fecal Occult Blood, POC: NEGATIVE

## 2019-04-28 MED ORDER — ESTROGENS CONJUGATED 0.625 MG PO TABS: 0.6250 mg | ORAL_TABLET | Freq: Every day | ORAL | 12 refills | Status: AC

## 2019-04-28 NOTE — Progress Notes (Signed)
Patient ID: Joanna Jones, female   DOB: Sep 21, 1945, 73 y.o.   MRN: [id number] History of Present Illness:  Joanna Jones is a 73 year old white female, married, sp hysterectomy in her 50's for bleeding in for a well woman gyn exam.She is on premarin and has noticed more hot flashes lately.She still works part time as a Emergency planning/management officer. PCP is Dr Quillian Quince.  Current Medications, Allergies, Past Medical History, Past Surgical History, Family History and Social History were reviewed in [employer] record.     Review of Systems: Patient denies any headaches, hearing loss, fatigue, blurred vision, shortness of breath, chest pain, abdominal pain, problems with bowel movements,  or intercourse(not active). No mood swings. She has pain in stiffness in hands and fingers at times. She said she feel 2 years ago and hurt her back and had concussion and since then has to semi stand to empty bladder, denies any loss of urine.She also has pain in LLQ at times, was bad for 2 days several days ago, but has noticed discomfort there since surgery. Has left nasal congestion since starting losartan.   Physical Exam:BP (!) 155/74 (BP Location: Right Arm, Patient Position: Sitting, Cuff Size: Normal)   Pulse 61   Ht 5\' 4"  (1.626 m)   Wt 153 lb (69.4 kg)   BMI 26.26 kg/m  General:  Well developed, well nourished, no acute distress Skin:  Warm and dry Neck:  Midline trachea, normal thyroid, good ROM, no lymphadenopathy,no carotid bruits heard Lungs; Clear to auscultation bilaterally Breast:  No dominant palpable mass, retraction, or nipple discharge Cardiovascular: Regular rate and rhythm Abdomen:  Soft, non tender, no hepatosplenomegaly Pelvic:  External genitalia is normal in appearance, no lesions.  The vagina is pale with loss of color, moisture and rugae. Urethra has no lesions or masses. The cervix and uterus are absent. No adnexal masses, LLQ tenderness noted.Bladder is non tender, no masses  felt. Rectal: Good sphincter tone, no polyps, or hemorrhoids felt.  Hemoccult negative. Extremities/musculoskeletal:  No swelling or varicosities noted, no clubbing or cyanosis Psych:  No mood changes, alert and cooperative,seems happy Fall risk is low PHQ 2 score 0. Examination chaperoned by Levy Pupa LPN.  Impression and Plan: 1. Encounter for well woman exam with routine gynecological exam -encouraged to get mammogram now, as it has been awhile -lab with PCP -Colonoscopy per GI  2. Screening for colorectal cancer  3. Hot flashes -will rx premarin .625 mg, to take whole tablet, she had been cutting 1.25, told not to do that    4. Current use of estrogen therapy Meds ordered this encounter  Medications  . estrogens, conjugated, (PREMARIN) 0.625 MG tablet    Sig: Take 1 tablet (0.625 mg total) by mouth daily. Take daily for 21 days then do not take for 7 days.    Dispense:  30 tablet    Refill:  12    Order Specific Question:   Supervising Provider    Answer:   Elonda Husky, LUTHER H [[id number]]    5. LLQ pain Will get GYN [place] to assess pain in about a week and will talk when results back

## 2019-05-05 ENCOUNTER — Other Ambulatory Visit: Payer: PPO

## 2019-05-11 ENCOUNTER — Telehealth: Payer: Self-pay | Admitting: Obstetrics & Gynecology

## 2019-05-11 NOTE — Telephone Encounter (Signed)

## 2019-05-12 ENCOUNTER — Other Ambulatory Visit: Payer: Self-pay

## 2019-05-12 ENCOUNTER — Ambulatory Visit (INDEPENDENT_AMBULATORY_CARE_PROVIDER_SITE_OTHER): Payer: PPO

## 2019-05-12 DIAGNOSIS — R1032 Left lower quadrant pain: Secondary | ICD-10-CM | POA: Diagnosis not present

## 2019-05-12 NOTE — Progress Notes (Signed)
PELVIC US TA/TV: normal vaginal cuff,ovaries not visualized,no free fluid,no pain during ultrasound

## 2019-05-18 ENCOUNTER — Telehealth: Payer: Self-pay | Admitting: Adult Health

## 2019-05-18 NOTE — Telephone Encounter (Signed)
Pt aware that [place] was negative, could be bowel or muscle related

## 2019-07-06 DIAGNOSIS — Z1388 Encounter for screening for disorder due to exposure to contaminants: Secondary | ICD-10-CM | POA: Diagnosis not present

## 2019-07-06 DIAGNOSIS — R030 Elevated blood-pressure reading, without diagnosis of hypertension: Secondary | ICD-10-CM | POA: Diagnosis not present

## 2019-07-06 DIAGNOSIS — I10 Essential (primary) hypertension: Secondary | ICD-10-CM | POA: Diagnosis not present

## 2019-07-06 DIAGNOSIS — F411 Generalized anxiety disorder: Secondary | ICD-10-CM | POA: Diagnosis not present

## 2019-07-09 DIAGNOSIS — Z6825 Body mass index (BMI) 25.0-25.9, adult: Secondary | ICD-10-CM | POA: Diagnosis not present

## 2019-07-09 DIAGNOSIS — F411 Generalized anxiety disorder: Secondary | ICD-10-CM | POA: Diagnosis not present

## 2019-07-09 DIAGNOSIS — I10 Essential (primary) hypertension: Secondary | ICD-10-CM | POA: Diagnosis not present

## 2019-07-09 DIAGNOSIS — J301 Allergic rhinitis due to pollen: Secondary | ICD-10-CM | POA: Diagnosis not present

## 2019-07-09 DIAGNOSIS — Z0001 Encounter for general adult medical examination with abnormal findings: Secondary | ICD-10-CM | POA: Diagnosis not present

## 2019-09-21 DIAGNOSIS — M722 Plantar fascial fibromatosis: Secondary | ICD-10-CM | POA: Diagnosis not present

## 2019-09-21 DIAGNOSIS — M79671 Pain in right foot: Secondary | ICD-10-CM | POA: Diagnosis not present

## 2019-12-16 DIAGNOSIS — Z96641 Presence of right artificial hip joint: Secondary | ICD-10-CM | POA: Diagnosis not present

## 2019-12-31 DIAGNOSIS — M19042 Primary osteoarthritis, left hand: Secondary | ICD-10-CM | POA: Diagnosis not present

## 2019-12-31 DIAGNOSIS — M79642 Pain in left hand: Secondary | ICD-10-CM | POA: Diagnosis not present

## 2019-12-31 DIAGNOSIS — M19041 Primary osteoarthritis, right hand: Secondary | ICD-10-CM | POA: Diagnosis not present

## 2019-12-31 DIAGNOSIS — M79641 Pain in right hand: Secondary | ICD-10-CM | POA: Diagnosis not present

## 2020-03-15 DIAGNOSIS — M722 Plantar fascial fibromatosis: Secondary | ICD-10-CM | POA: Diagnosis not present

## 2020-03-15 DIAGNOSIS — M79671 Pain in right foot: Secondary | ICD-10-CM | POA: Diagnosis not present

## 2020-04-05 DIAGNOSIS — M722 Plantar fascial fibromatosis: Secondary | ICD-10-CM | POA: Diagnosis not present

## 2020-04-05 DIAGNOSIS — M79671 Pain in right foot: Secondary | ICD-10-CM | POA: Diagnosis not present

## 2020-04-26 DIAGNOSIS — M79671 Pain in right foot: Secondary | ICD-10-CM | POA: Diagnosis not present

## 2020-04-26 DIAGNOSIS — M722 Plantar fascial fibromatosis: Secondary | ICD-10-CM | POA: Diagnosis not present

## 2020-06-29 DIAGNOSIS — J0100 Acute maxillary sinusitis, unspecified: Secondary | ICD-10-CM | POA: Diagnosis not present

## 2020-09-02 DIAGNOSIS — Z0001 Encounter for general adult medical examination with abnormal findings: Secondary | ICD-10-CM | POA: Diagnosis not present

## 2020-09-02 DIAGNOSIS — Z1329 Encounter for screening for other suspected endocrine disorder: Secondary | ICD-10-CM | POA: Diagnosis not present

## 2020-09-02 DIAGNOSIS — Z6825 Body mass index (BMI) 25.0-25.9, adult: Secondary | ICD-10-CM | POA: Diagnosis not present

## 2020-09-02 DIAGNOSIS — Z13220 Encounter for screening for lipoid disorders: Secondary | ICD-10-CM | POA: Diagnosis not present

## 2020-09-02 DIAGNOSIS — I10 Essential (primary) hypertension: Secondary | ICD-10-CM | POA: Diagnosis not present

## 2020-09-07 DIAGNOSIS — R4582 Worries: Secondary | ICD-10-CM | POA: Diagnosis not present

## 2020-09-07 DIAGNOSIS — F411 Generalized anxiety disorder: Secondary | ICD-10-CM | POA: Diagnosis not present

## 2020-09-07 DIAGNOSIS — J301 Allergic rhinitis due to pollen: Secondary | ICD-10-CM | POA: Diagnosis not present

## 2020-09-07 DIAGNOSIS — I10 Essential (primary) hypertension: Secondary | ICD-10-CM | POA: Diagnosis not present

## 2020-09-07 DIAGNOSIS — Z6825 Body mass index (BMI) 25.0-25.9, adult: Secondary | ICD-10-CM | POA: Diagnosis not present

## 2020-09-07 DIAGNOSIS — Z0001 Encounter for general adult medical examination with abnormal findings: Secondary | ICD-10-CM | POA: Diagnosis not present

## 2020-09-07 DIAGNOSIS — Z1212 Encounter for screening for malignant neoplasm of rectum: Secondary | ICD-10-CM | POA: Diagnosis not present

## 2020-09-19 DIAGNOSIS — R300 Dysuria: Secondary | ICD-10-CM | POA: Diagnosis not present

## 2020-10-18 DIAGNOSIS — H2513 Age-related nuclear cataract, bilateral: Secondary | ICD-10-CM | POA: Diagnosis not present

## 2020-10-18 DIAGNOSIS — H43813 Vitreous degeneration, bilateral: Secondary | ICD-10-CM | POA: Diagnosis not present

## 2020-10-18 DIAGNOSIS — H04123 Dry eye syndrome of bilateral lacrimal glands: Secondary | ICD-10-CM | POA: Diagnosis not present

## 2020-10-18 DIAGNOSIS — H524 Presbyopia: Secondary | ICD-10-CM | POA: Diagnosis not present

## 2021-02-23 DIAGNOSIS — R52 Pain, unspecified: Secondary | ICD-10-CM | POA: Diagnosis not present

## 2021-02-23 DIAGNOSIS — M79641 Pain in right hand: Secondary | ICD-10-CM | POA: Diagnosis not present

## 2021-02-23 DIAGNOSIS — M19041 Primary osteoarthritis, right hand: Secondary | ICD-10-CM | POA: Diagnosis not present

## 2021-02-23 DIAGNOSIS — M79642 Pain in left hand: Secondary | ICD-10-CM | POA: Diagnosis not present

## 2021-02-23 DIAGNOSIS — M19042 Primary osteoarthritis, left hand: Secondary | ICD-10-CM | POA: Diagnosis not present

## 2021-04-12 DIAGNOSIS — M19041 Primary osteoarthritis, right hand: Secondary | ICD-10-CM | POA: Diagnosis not present

## 2021-04-12 DIAGNOSIS — M79641 Pain in right hand: Secondary | ICD-10-CM | POA: Diagnosis not present

## 2021-04-12 DIAGNOSIS — M79642 Pain in left hand: Secondary | ICD-10-CM | POA: Diagnosis not present

## 2021-04-12 DIAGNOSIS — M19042 Primary osteoarthritis, left hand: Secondary | ICD-10-CM | POA: Diagnosis not present

## 2021-09-04 DIAGNOSIS — Z13220 Encounter for screening for lipoid disorders: Secondary | ICD-10-CM | POA: Diagnosis not present

## 2021-09-04 DIAGNOSIS — I10 Essential (primary) hypertension: Secondary | ICD-10-CM | POA: Diagnosis not present

## 2021-09-04 DIAGNOSIS — R030 Elevated blood-pressure reading, without diagnosis of hypertension: Secondary | ICD-10-CM | POA: Diagnosis not present

## 2021-09-04 DIAGNOSIS — Z1329 Encounter for screening for other suspected endocrine disorder: Secondary | ICD-10-CM | POA: Diagnosis not present

## 2021-09-11 DIAGNOSIS — Z1331 Encounter for screening for depression: Secondary | ICD-10-CM | POA: Diagnosis not present

## 2021-09-11 DIAGNOSIS — Z0001 Encounter for general adult medical examination with abnormal findings: Secondary | ICD-10-CM | POA: Diagnosis not present

## 2021-09-11 DIAGNOSIS — Z23 Encounter for immunization: Secondary | ICD-10-CM | POA: Diagnosis not present

## 2021-09-11 DIAGNOSIS — I10 Essential (primary) hypertension: Secondary | ICD-10-CM | POA: Diagnosis not present

## 2021-09-11 DIAGNOSIS — Z1389 Encounter for screening for other disorder: Secondary | ICD-10-CM | POA: Diagnosis not present

## 2021-09-11 DIAGNOSIS — Z6825 Body mass index (BMI) 25.0-25.9, adult: Secondary | ICD-10-CM | POA: Diagnosis not present

## 2021-09-11 DIAGNOSIS — J301 Allergic rhinitis due to pollen: Secondary | ICD-10-CM | POA: Diagnosis not present

## 2021-09-11 DIAGNOSIS — F411 Generalized anxiety disorder: Secondary | ICD-10-CM | POA: Diagnosis not present

## 2021-09-13 DIAGNOSIS — M79641 Pain in right hand: Secondary | ICD-10-CM | POA: Diagnosis not present

## 2021-09-13 DIAGNOSIS — M19041 Primary osteoarthritis, right hand: Secondary | ICD-10-CM | POA: Diagnosis not present

## 2021-09-13 DIAGNOSIS — M19042 Primary osteoarthritis, left hand: Secondary | ICD-10-CM | POA: Diagnosis not present

## 2021-09-13 DIAGNOSIS — M79642 Pain in left hand: Secondary | ICD-10-CM | POA: Diagnosis not present

## 2021-10-16 DIAGNOSIS — R040 Epistaxis: Secondary | ICD-10-CM | POA: Diagnosis not present

## 2021-10-16 DIAGNOSIS — I10 Essential (primary) hypertension: Secondary | ICD-10-CM | POA: Diagnosis not present

## 2021-10-17 DIAGNOSIS — J3489 Other specified disorders of nose and nasal sinuses: Secondary | ICD-10-CM | POA: Diagnosis not present

## 2021-10-17 DIAGNOSIS — R040 Epistaxis: Secondary | ICD-10-CM | POA: Diagnosis not present

## 2021-10-17 DIAGNOSIS — J342 Deviated nasal septum: Secondary | ICD-10-CM | POA: Diagnosis not present

## 2021-11-28 DIAGNOSIS — Z23 Encounter for immunization: Secondary | ICD-10-CM | POA: Diagnosis not present

## 2022-01-17 DIAGNOSIS — Z6825 Body mass index (BMI) 25.0-25.9, adult: Secondary | ICD-10-CM | POA: Diagnosis not present

## 2022-01-17 DIAGNOSIS — R030 Elevated blood-pressure reading, without diagnosis of hypertension: Secondary | ICD-10-CM | POA: Diagnosis not present

## 2022-01-17 DIAGNOSIS — N390 Urinary tract infection, site not specified: Secondary | ICD-10-CM | POA: Diagnosis not present

## 2022-02-19 DIAGNOSIS — M79644 Pain in right finger(s): Secondary | ICD-10-CM | POA: Diagnosis not present

## 2022-02-19 DIAGNOSIS — M19042 Primary osteoarthritis, left hand: Secondary | ICD-10-CM | POA: Diagnosis not present

## 2022-02-19 DIAGNOSIS — M79645 Pain in left finger(s): Secondary | ICD-10-CM | POA: Diagnosis not present

## 2022-02-19 DIAGNOSIS — M19041 Primary osteoarthritis, right hand: Secondary | ICD-10-CM | POA: Diagnosis not present

## 2022-05-31 DIAGNOSIS — R21 Rash and other nonspecific skin eruption: Secondary | ICD-10-CM | POA: Diagnosis not present

## 2022-09-10 DIAGNOSIS — Z0000 Encounter for general adult medical examination without abnormal findings: Secondary | ICD-10-CM | POA: Diagnosis not present

## 2022-09-10 DIAGNOSIS — I10 Essential (primary) hypertension: Secondary | ICD-10-CM | POA: Diagnosis not present

## 2022-09-10 DIAGNOSIS — F411 Generalized anxiety disorder: Secondary | ICD-10-CM | POA: Diagnosis not present

## 2022-09-10 DIAGNOSIS — Z13220 Encounter for screening for lipoid disorders: Secondary | ICD-10-CM | POA: Diagnosis not present

## 2022-09-10 DIAGNOSIS — Z1329 Encounter for screening for other suspected endocrine disorder: Secondary | ICD-10-CM | POA: Diagnosis not present

## 2022-09-17 DIAGNOSIS — I10 Essential (primary) hypertension: Secondary | ICD-10-CM | POA: Diagnosis not present

## 2022-09-17 DIAGNOSIS — Z0001 Encounter for general adult medical examination with abnormal findings: Secondary | ICD-10-CM | POA: Diagnosis not present

## 2022-09-17 DIAGNOSIS — Z6825 Body mass index (BMI) 25.0-25.9, adult: Secondary | ICD-10-CM | POA: Diagnosis not present

## 2022-09-17 DIAGNOSIS — E7849 Other hyperlipidemia: Secondary | ICD-10-CM | POA: Diagnosis not present

## 2022-09-17 DIAGNOSIS — F411 Generalized anxiety disorder: Secondary | ICD-10-CM | POA: Diagnosis not present

## 2022-09-17 DIAGNOSIS — J301 Allergic rhinitis due to pollen: Secondary | ICD-10-CM | POA: Diagnosis not present

## 2023-03-26 DIAGNOSIS — R300 Dysuria: Secondary | ICD-10-CM | POA: Diagnosis not present

## 2023-06-17 DIAGNOSIS — H903 Sensorineural hearing loss, bilateral: Secondary | ICD-10-CM | POA: Diagnosis not present

## 2023-06-25 DIAGNOSIS — H903 Sensorineural hearing loss, bilateral: Secondary | ICD-10-CM | POA: Diagnosis not present

## 2023-09-10 DIAGNOSIS — M79672 Pain in left foot: Secondary | ICD-10-CM | POA: Diagnosis not present

## 2023-09-10 DIAGNOSIS — M722 Plantar fascial fibromatosis: Secondary | ICD-10-CM | POA: Diagnosis not present

## 2023-09-20 DIAGNOSIS — H2513 Age-related nuclear cataract, bilateral: Secondary | ICD-10-CM | POA: Diagnosis not present

## 2023-09-20 DIAGNOSIS — H524 Presbyopia: Secondary | ICD-10-CM | POA: Diagnosis not present

## 2023-09-20 DIAGNOSIS — H04123 Dry eye syndrome of bilateral lacrimal glands: Secondary | ICD-10-CM | POA: Diagnosis not present

## 2023-10-08 DIAGNOSIS — M722 Plantar fascial fibromatosis: Secondary | ICD-10-CM | POA: Diagnosis not present

## 2023-10-28 DIAGNOSIS — H25013 Cortical age-related cataract, bilateral: Secondary | ICD-10-CM | POA: Diagnosis not present

## 2023-11-20 DIAGNOSIS — H25811 Combined forms of age-related cataract, right eye: Secondary | ICD-10-CM | POA: Diagnosis not present

## 2023-11-20 DIAGNOSIS — H2511 Age-related nuclear cataract, right eye: Secondary | ICD-10-CM | POA: Diagnosis not present

## 2024-01-08 DIAGNOSIS — E7849 Other hyperlipidemia: Secondary | ICD-10-CM | POA: Diagnosis not present

## 2024-01-08 DIAGNOSIS — Z0001 Encounter for general adult medical examination with abnormal findings: Secondary | ICD-10-CM | POA: Diagnosis not present

## 2024-01-08 DIAGNOSIS — Z1329 Encounter for screening for other suspected endocrine disorder: Secondary | ICD-10-CM | POA: Diagnosis not present

## 2024-01-08 DIAGNOSIS — I10 Essential (primary) hypertension: Secondary | ICD-10-CM | POA: Diagnosis not present

## 2024-01-17 DIAGNOSIS — Z0001 Encounter for general adult medical examination with abnormal findings: Secondary | ICD-10-CM | POA: Diagnosis not present

## 2024-01-17 DIAGNOSIS — Z1331 Encounter for screening for depression: Secondary | ICD-10-CM | POA: Diagnosis not present

## 2024-01-17 DIAGNOSIS — Z1389 Encounter for screening for other disorder: Secondary | ICD-10-CM | POA: Diagnosis not present

## 2024-01-17 DIAGNOSIS — Z6825 Body mass index (BMI) 25.0-25.9, adult: Secondary | ICD-10-CM | POA: Diagnosis not present

## 2024-01-17 DIAGNOSIS — I10 Essential (primary) hypertension: Secondary | ICD-10-CM | POA: Diagnosis not present

## 2024-01-17 DIAGNOSIS — E7849 Other hyperlipidemia: Secondary | ICD-10-CM | POA: Diagnosis not present

## 2024-01-17 DIAGNOSIS — F411 Generalized anxiety disorder: Secondary | ICD-10-CM | POA: Diagnosis not present

## 2024-01-17 DIAGNOSIS — E782 Mixed hyperlipidemia: Secondary | ICD-10-CM | POA: Diagnosis not present

## 2024-01-22 DIAGNOSIS — H25812 Combined forms of age-related cataract, left eye: Secondary | ICD-10-CM | POA: Diagnosis not present

## 2024-01-22 DIAGNOSIS — H2512 Age-related nuclear cataract, left eye: Secondary | ICD-10-CM | POA: Diagnosis not present

## 2024-06-19 NOTE — Progress Notes (Signed)
 Joanna Jones                                          MRN: [id number]   06/19/2024   The [facility] Quality Team Specialist reviewed this patient medical record for the purposes of chart review for care gap closure. The following were reviewed: chart review for care gap closure-controlling blood pressure.    [facility] Quality Team

## 2024-08-31 ENCOUNTER — Ambulatory Visit: Payer: Self-pay | Admitting: Neurology

## 2024-09-04 ENCOUNTER — Ambulatory Visit: Payer: Self-pay | Admitting: Neurology

## 2024-09-04 ENCOUNTER — Encounter: Payer: Self-pay | Admitting: Neurology

## 2024-09-04 VITALS — BP 200/76 | HR 70 | Ht 66.0 in | Wt 154.5 lb

## 2024-09-04 DIAGNOSIS — G500 Trigeminal neuralgia: Secondary | ICD-10-CM | POA: Insufficient documentation

## 2024-09-04 MED ORDER — GABAPENTIN 300 MG PO CAPS: 300.0000 mg | ORAL_CAPSULE | Freq: Three times a day (TID) | ORAL | 6 refills | Status: AC | PRN

## 2024-09-04 NOTE — Progress Notes (Signed)
 "  Chief Complaint  Patient presents with   New Patient (Initial Visit)    Pt in room 14.alone.Paper referral for neuropathic facial pain.      ASSESSMENT AND PLAN  Joanna Jones is a 79 y.o. female   Left V3 trigeminal neuralgia  MRI of the brain with without contrast to trigeminal area  Lab  Gabapentin  300mg  2 tid prn  RTC in 2-3 months with NP  DIAGNOSTIC DATA (LABS, IMAGING, TESTING) - I reviewed patient records, labs, notes, testing and imaging myself where available.   MEDICAL HISTORY:  Joanna Jones, is a 79 year old female, accompanied by her husband, seen in request by primary care from [facility] Dr. Toribio Larve for evaluation of left facial pain, initial evaluation September 04, 2024  History is obtained from the patient and review of electronic medical records. I personally reviewed pertinent available imaging films in PACS.   PMHx of  HTN Anxiety Right hip replacement  In September 2025 she began to notice left lower jaw pain, was seen by dentist, worry about decay underneath old crown, she was treated with antibiotic, redo of her crown, but she had worsening left facial pain, by October, she had almost constant sharp radiating pain from left ear to left jaw, jolt like sensation, she has difficulty talking, eating, can only tolerate cream potato  Multiple recheck of her left tooth showed no significant abnormality, she denies rash broke out, had chronic hearing loss wearing hearing aids bilaterally  PHYSICAL EXAM:   Vitals:   09/04/24 0810 09/04/24 0814  BP: (!) 200/74 (!) 200/76  Pulse: 70   Weight: 154 lb 8 oz (70.1 kg)   Height: 5' 6 (1.676 m)    Body mass index is 24.94 kg/m.  PHYSICAL EXAMNIATION:  Gen: NAD, conversant, well nourised, well groomed                     Cardiovascular: Regular rate rhythm, no peripheral edema, warm, nontender. Eyes: Conjunctivae clear without exudates or hemorrhage Neck: Supple, no carotid bruits. Pulmonary:  Clear to auscultation bilaterally   NEUROLOGICAL EXAM:  MENTAL STATUS: Speech/cognition: Awake, alert, oriented to history taking and casual conversation CRANIAL NERVES: CN II: Visual fields are full to confrontation. Pupils are round equal and briskly reactive to light. CN III, IV, VI: extraocular movement are normal. No ptosis. CN V: Facial sensation is intact to light touch, bilateral corneal reflexes were symmetric. CN VII: Face is symmetric with normal eye closure  CN VIII: Hard of hearing CN IX, X: Phonation is normal. CN XI: Head turning and shoulder shrug are intact  MOTOR: There is no pronator drift of out-stretched arms. Muscle bulk and tone are normal. Muscle strength is normal.  REFLEXES: Reflexes are 1 and symmetric at the biceps, triceps, knees, and ankles. Plantar responses are flexor.  SENSORY: Intact to light touch, pinprick and vibratory sensation are intact in fingers and toes.  COORDINATION: There is no trunk or limb dysmetria noted.  GAIT/STANCE: Posture is normal. Gait is steady   REVIEW OF SYSTEMS:  Full 14 system review of systems performed and notable only for as above All other review of systems were negative.   ALLERGIES: Allergies[1]  HOME MEDICATIONS: Current Outpatient Medications  Medication Sig Dispense Refill   cyclobenzaprine (FLEXERIL) 10 MG tablet Take 10 mg by mouth every 8 (eight) hours as needed for muscle spasms.     estrogens , conjugated, (PREMARIN ) 0.625 MG tablet Take 1 tablet (0.625 mg total) by mouth  daily. Take daily for 21 days then do not take for 7 days. 30 tablet 12   LORazepam  (ATIVAN ) 0.5 MG tablet Take 0.5 mg by mouth every 8 (eight) hours as needed for anxiety.     losartan (COZAAR) 100 MG tablet 100 mg daily.      polyvinyl alcohol  (ARTIFICIAL TEARS) 1.4 % ophthalmic solution Place 1 drop into both eyes 4 (four) times daily as needed for dry eyes.     No current facility-administered medications for this visit.     PAST MEDICAL HISTORY: Past Medical History:  Diagnosis Date   Anxiety    Arthritis    Hypertension     PAST SURGICAL HISTORY: Past Surgical History:  Procedure Laterality Date   ABDOMINAL HYSTERECTOMY     COLONOSCOPY     HIP ARTHROPLASTY Right 03/05/2015   Procedure: ARTHROPLASTY BIPOLAR HIP (HEMIARTHROPLASTY);  Surgeon: Marcey Her, MD;  Location: [facility];  Service: Orthopedics;  Laterality: Right;   TONSILLECTOMY     TOTAL HIP ARTHROPLASTY Right 12/12/2015   Procedure: CONVERSION RIGHT HEMI ARTHROPLASTY TO TOTAL HIP ARTHROPLASTY POSTERIOR APPROACH;  Surgeon: Donnice Car, MD;  Location: [facility];  Service: Orthopedics;  Laterality: Right;    FAMILY HISTORY: Family History  Problem Relation Age of Onset   Diabetes Mellitus II Mother    CAD Father    Hypertension Father     SOCIAL HISTORY: Social History   Socioeconomic History   Marital status: Married    Spouse name: Not on file   Number of children: Not on file   Years of education: Not on file   Highest education level: Not on file  Occupational History   Not on file  Tobacco Use   Smoking status: Never   Smokeless tobacco: Never  Vaping Use   Vaping status: Never Used  Substance and Sexual Activity   Alcohol  use: No   Drug use: No   Sexual activity: Not Currently    Birth control/protection: Surgical    Comment: hyst  Other Topics Concern   Not on file  Social History Narrative   Not on file   Social Drivers of Health   Tobacco Use: Low Risk (09/04/2024)   Patient History    Smoking Tobacco Use: Never    Smokeless Tobacco Use: Never    Passive Exposure: Not on file  Financial Resource Strain: Not on file  Food Insecurity: Not on file  Transportation Needs: Not on file  Physical Activity: Not on file  Stress: Not on file  Social Connections: Not on file  Intimate Partner Violence: Not on file  Depression (EYV7-0): Not on file  Alcohol  Screen: Not on file  Housing: Not on file  Utilities: Not  on file  Health Literacy: Not on file      Modena Callander, M.D. Ph.D.  [facility] [address], [address] [place], [place] [address] Ph: [phone] Fax: [phone]  CC:  Toribio Jerel MATSU, MD [address] [place],  [place] [address]  Toribio Jerel MATSU, MD       [1]  Allergies Allergen Reactions   Compazine Other (See Comments)    Shot nervous systems   Sulfa Antibiotics Other (See Comments)    Blisters inside mouth   Tramadol Hcl Itching    Still takes regardless of reaction to it   "

## 2024-12-01 ENCOUNTER — Ambulatory Visit: Admitting: Family Medicine
